# Patient Record
Sex: Male | Born: 1955
Health system: Southern US, Community
[De-identification: ages and names within clinical notes are randomized; demographics above are authoritative.]

## PROBLEM LIST (undated history)

## (undated) DIAGNOSIS — E785 Hyperlipidemia, unspecified: Secondary | ICD-10-CM

## (undated) DIAGNOSIS — M549 Dorsalgia, unspecified: Secondary | ICD-10-CM

## (undated) DIAGNOSIS — M6289 Other specified disorders of muscle: Secondary | ICD-10-CM

## (undated) DIAGNOSIS — R0602 Shortness of breath: Secondary | ICD-10-CM

## (undated) DIAGNOSIS — E119 Type 2 diabetes mellitus without complications: Secondary | ICD-10-CM

## (undated) DIAGNOSIS — G4730 Sleep apnea, unspecified: Secondary | ICD-10-CM

## (undated) DIAGNOSIS — R5383 Other fatigue: Secondary | ICD-10-CM

## (undated) HISTORY — PX: NO PAST SURGERIES: SHX2092

## (undated) HISTORY — DX: Dorsalgia, unspecified: M54.9

## (undated) HISTORY — DX: Other specified disorders of muscle: M62.89

## (undated) HISTORY — DX: Shortness of breath: R06.02

## (undated) HISTORY — PX: BACK SURGERY: SHX140

## (undated) HISTORY — DX: Type 2 diabetes mellitus without complications: E11.9

## (undated) HISTORY — DX: Other fatigue: R53.83

## (undated) HISTORY — PX: COLONOSCOPY: SHX174

---

## 2000-01-25 ENCOUNTER — Other Ambulatory Visit: Admission: RE | Admit: 2000-01-25 | Discharge: 2000-01-25 | Payer: Self-pay | Admitting: Urology

## 2000-07-27 ENCOUNTER — Encounter: Payer: Self-pay | Admitting: Otolaryngology

## 2000-07-27 ENCOUNTER — Ambulatory Visit (HOSPITAL_COMMUNITY): Admission: RE | Admit: 2000-07-27 | Discharge: 2000-07-27 | Payer: Self-pay | Admitting: Otolaryngology

## 2003-12-11 ENCOUNTER — Ambulatory Visit (HOSPITAL_BASED_OUTPATIENT_CLINIC_OR_DEPARTMENT_OTHER): Admission: RE | Admit: 2003-12-11 | Discharge: 2003-12-11 | Payer: Self-pay | Admitting: Family Medicine

## 2006-06-06 ENCOUNTER — Ambulatory Visit: Payer: Self-pay | Admitting: Infectious Diseases

## 2009-08-22 ENCOUNTER — Encounter: Admission: RE | Admit: 2009-08-22 | Discharge: 2009-08-22 | Payer: Self-pay | Admitting: Urology

## 2012-08-03 ENCOUNTER — Other Ambulatory Visit: Payer: Self-pay | Admitting: Urology

## 2012-08-03 DIAGNOSIS — Z8051 Family history of malignant neoplasm of kidney: Secondary | ICD-10-CM

## 2012-08-04 ENCOUNTER — Ambulatory Visit
Admission: RE | Admit: 2012-08-04 | Discharge: 2012-08-04 | Disposition: A | Discharge status: Home / Self Care | Payer: 59 | Source: Ambulatory Visit | Attending: Urology | Admitting: Urology

## 2012-08-04 DIAGNOSIS — Z8051 Family history of malignant neoplasm of kidney: Secondary | ICD-10-CM

## 2013-11-23 ENCOUNTER — Encounter: Payer: Self-pay | Admitting: Family Medicine

## 2013-11-23 ENCOUNTER — Ambulatory Visit (INDEPENDENT_AMBULATORY_CARE_PROVIDER_SITE_OTHER): Payer: 59 | Admitting: Family Medicine

## 2013-11-23 VITALS — Ht 71.0 in | Wt 240.8 lb

## 2013-11-23 DIAGNOSIS — R7303 Prediabetes: Secondary | ICD-10-CM

## 2013-11-23 DIAGNOSIS — R7309 Other abnormal glucose: Secondary | ICD-10-CM

## 2013-11-23 NOTE — Progress Notes (Signed)
Medical Nutrition Therapy:  Appt start time: 1430 end time:  1530.  Assessment:  Primary concerns today: Weight management and Blood sugar control.  [name] Wood's BG had been increasing, and Dr. Azucena Kubaeid has told him he is pre-diabetic.  [name] does not want to use any med for DM.  A1C last fall was "high-6," and after a 10-lb wt loss he got it down to 6.1.  He has been trying to limit carb's, ice cream, etc.  He is using [employer], and is targeting no more than 1800 kcal/day.  He has lost ~20 lb since November.  He would like to get under 200 lb.    Usual eating pattern includes 3 meals and 0-2 snacks per day.  Restaurant meals are frequent.   Usual physical activity includes 30-40 min TM at home 4 X wk.   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  He has a son who is a Printmakerfreshman in college, and a daughter who is a Printmakerfreshman in h.s.   Frequent foods include breakfast 3-4 X wk of 2 scrmbled eggs, canadian bacon, 1/3 c blueberries OR steel-cut oats (1/2 c dry) w/ bl'berries and walnuts; coffee w/ sugar-free flavored creamer.      Progress Towards Goal(s):  In progress.   Nutritional Diagnosis:  -2.2 Altered nutrition-related laboratory As related to blood glucose.  As evidenced by A1C above 6.    Intervention:  Nutrition education.  Monitoring/Evaluation:  Dietary intake, exercise, and body weight April 13 at 10 AM.  NOTE:  [name] Wood'S WIFE CALLED DURING HIS APPT, AND HE HAD TO [facility] HOME TO DRIVE HER TO URGENT CARE FOR STITCHES ON HER FINGER.  WILL FINISH APPT ON April 13.

## 2013-11-23 NOTE — Patient Instructions (Signed)
NOTE:  GREG'S WIFE CALLED DURING HIS APPT, AND HE HAD TO [facility] HOME TO DRIVE HER TO URGENT CARE FOR STITCHES ON HER FINGER.  WILL FINISH APPT ON April 13.

## 2013-12-06 ENCOUNTER — Encounter: Payer: Self-pay | Admitting: Family Medicine

## 2013-12-06 NOTE — Progress Notes (Signed)
Patient ID: [name] Wood, male   DOB: Dec 27, 1955, 58 y.o.   MRN: [id number] [name] Wood has been checking FBG and bedtime on alternating days.  FBG have been running 112-140; evenings (3.5-4 hrs post-prandials) are 100-125.  He is still tracking most days with a food and ex app.   Recommendations provided today (follow-up from last appt on 3/31):  Diet Recommendations for Diabetes   Starchy (carb) foods include: Bread, rice, pasta, potatoes, corn, crackers, bagels, muffins, all baked goods.  (Fruits, milk, and yogurt also have carbohydrate, but most of these foods will not spike your blood sugar as the starchy foods will.)  A few fruits do cause high blood sugars; use small portions of bananas (limit to 1/2 at a time), grapes, and most tropical fruits.    Protein foods include: Meat, fish, poultry, eggs, dairy foods, and beans such as pinto and kidney beans (beans also provide carbohydrate).   1. Eat at least 3 meals and 1-2 snacks per day. Never go more than 4-5 hours while awake without eating.  2. Limit starchy foods to TWO per meal and ONE per snack. ONE portion of a starchy  food is equal to the following:   - ONE slice of bread (or its equivalent, such as half of a hamburger bun).   - 1/2 cup of a "scoopable" starchy food such as potatoes or rice.   - 15 grams of carbohydrate as shown on food label.  3. Both lunch and dinner should include a protein food, a carb food, and vegetables.   - Obtain twice as many veg's as protein or carbohydrate foods for both lunch and dinner.   - Fresh or frozen veg's are best.   - Try to keep frozen veg's on hand for a quick vegetable serving.    4. Breakfast should always include protein.   5. Exercise:  30 min at least 4 days a week.  In addn, it's important to move throughout the day (micro-breaks of 5 min moving for every hour sitting or 1 min for every 20 min).    Nuts are loaded with fat; therefore, you have to be careful with quantity.   c = 200 kcal.   They are a very high quality of fat, but lots of kcal.  Always (as with any food) put them on a plate/in a bowl.  Principle of wt mgmt: SEE all you eat.    Restaurants:  Practice sequential eating; veg's first, meat or calorie-dense foods last.    When you get a very high or very low BG reading, immediately think back to what you last ate: What, how much, and what time?  (Is there anything else going on that can raise/lower your BG - exercise, stress, sleep deprivation, sickness?)

## 2015-11-10 ENCOUNTER — Other Ambulatory Visit: Payer: Self-pay | Admitting: Urology

## 2015-11-10 DIAGNOSIS — R3129 Other microscopic hematuria: Secondary | ICD-10-CM

## 2015-12-15 ENCOUNTER — Ambulatory Visit
Admission: RE | Admit: 2015-12-15 | Discharge: 2015-12-15 | Disposition: A | Discharge status: Home / Self Care | Payer: 59 | Source: Ambulatory Visit | Attending: Urology | Admitting: Urology

## 2015-12-15 DIAGNOSIS — R3129 Other microscopic hematuria: Secondary | ICD-10-CM

## 2016-08-28 ENCOUNTER — Ambulatory Visit: Payer: Self-pay | Admitting: Orthopedic Surgery

## 2016-09-13 ENCOUNTER — Ambulatory Visit: Payer: Self-pay | Admitting: Orthopedic Surgery

## 2016-09-13 NOTE — H&P (Signed)
[name] Wood is an 61 y.o. male.   Chief Complaint: back and left leg pain HPI: The patient is a 61 year old male who presents today for follow up of their back. The patient is being followed for their low back symptoms. They are now month(s) out from a flare up. Symptoms reported today include: pain. Current treatment includes: physical therapy, activity modification and pain medications. The following medication has been used for pain control: gabapentin. The patient presents today following physical therapy.  [name] Wood follows up after physical therapy. He continues to have significant pain radiating down into his leg. This is now over six months in duration. He had an epidural, periodically giving him temporary relief at L4-L5 on the left. Unfortunately, recently it was temporary. He was seen in physical therapy by [name] Wood and he is having pain. It helps him for about an hour then the pain returns, both with sitting and standing and walking. He is taking gabapentin.  Review of systems is negative for fevers, chest pain, shortness of breath, unexplained recent weight loss, loss of bowel or bladder function, burning with urination, joint swelling, rashes, weakness or numbness, difficulty with balance, easy bruising, excessive thirst or frequent urination.  He reports he is otherwise healthy. No chest pain, shortness of breath.  Past Medical Hx Hyperlipidemia  No past surgical history on file.  No family history on file. Social History:  reports that he has quit smoking. He does not have any smokeless tobacco history on file. He reports that he drinks about 0.5 oz of alcohol per week . His drug history is not on file.  Allergies: NKDA  Review of Systems  Constitutional: Negative.   HENT: Negative.   Eyes: Negative.   Respiratory: Negative.   Cardiovascular: Negative.   Gastrointestinal: Negative.   Genitourinary: Negative.   Musculoskeletal: Positive for back pain.  Skin:  Negative.   Neurological: Positive for sensory change and focal weakness.  Psychiatric/Behavioral: Negative.     There were no vitals taken for this visit. Physical Exam  Constitutional: He is oriented to person, place, and time. He appears well-developed. He appears distressed.  HENT:  Head: Normocephalic.  Eyes: Pupils are equal, round, and reactive to light.  Neck: Normal range of motion.  Cardiovascular: Normal rate.   Respiratory: Effort normal.  GI: Soft.  Musculoskeletal:  He is upright, mild distress. Mood and affect is appropriate. His straight leg raise on the left buttock, thigh, and calf pain. On the right it is negative. EHL is still 4+/5 on the left compared to the right, slightly altered sensation in S1 dermatome.  Lumbar spine exam reveals no evidence of soft tissue swelling, deformity or skin ecchymosis. On palpation there is no tenderness of the lumbar spine. No flank pain with percussion. The abdomen is soft and nontender. Nontender over the trochanters. No cellulitis or lymphadenopathy.  Good range of motion of the lumbar spine without associated pain. Motor is 5/5 including tibialis anterior, plantar flexion, quadriceps and hamstrings. Patient is normoreflexic. There is no Babinski or clonus. Sensory exam is intact to light touch. Patient has good distal pulses. No DVT. No pain and normal range of motion without instability of the hips, knees and ankles.  Neurological: He is alert and oriented to person, place, and time.    MRI demonstrates multilevel disc degeneration 3-4, 4-5, and 5-1. He has a disc protrusion at 3-4, mild lateral recess stenosis. He has no L3-L4 radicular pain. Extruded disk herniation with disk osteophyte  complex at 4-5 with severe lateral recess stenosis, affecting the L5 root. At L5-S1, he has a central disk protrusion not affecting the S1 nerve root. He has no S1 nerve root symptoms. On his patient's pain drawing today, he has specific L5 radicular  dermatomal pain.  Assessment/Plan L5 radicular pain secondary to disc herniation, disc osteophyte complex and spinal stenosis at L4-L5, spacing the L5 root with myotomal weakness, dermatomal dysesthesia, despite rest, activity modification, epidural, physical therapy.  We extensively discussed concerning current pathology, relevant anatomy, treatment options, living with his symptoms versus microlumbar decompression at 4-5, decompressing L5 root.  I had an extensive discussion of the risks and benefits of the lumbar decompression with the patient including bleeding, infection, damage to neurovascular structures, epidural fibrosis, CSF leak requiring repair. We also discussed increase in pain, adjacent segment disease, recurrent disc herniation, need for future surgery including repeat decompression and/or fusion. We also discussed risks of postoperative hematoma, paralysis, anesthetic complications including DVT, PE, death, cardiopulmonary dysfunction. In addition, the perioperative and postoperative courses were discussed in detail including the rehabilitative time and return to functional activity and work. I provided the patient with an illustrated handout and utilized the appropriate surgical models.  Specifically discussed decompressing the nerve root to allow it to heal which can take a period of time given the duration of neural compression and residual neurologic deficit is certainly possible. Hopefully, though with the decompression and continued PT, the myotomal weakness will improve. We discussed the hospital course postoperative course in detail. No history of DVT, MRSA exposure, chest pain, shortness of breath, stroke, or diabetes. Out of work up to 12 weeks, possible return to light duty in two weeks. We will proceed with scheduling.  Plan microlumbar decompression L4-5 left  [name] SparkBISSELL, [name] Godwin M., PA-C for Dr. Shelle Wood 09/13/2016, 11:35 AM

## 2016-09-23 NOTE — Patient Instructions (Addendum)
[name] Wood  09/23/2016   Your procedure is scheduled on: Wednesday 10/02/2016  Report to [facility] Main  Entrance take [place]  elevators to 3rd floor to  Short Stay Center at  0630  AM.  Call this number if you have problems the morning of surgery [phone]   Remember: ONLY 1 PERSON MAY GO WITH YOU TO SHORT STAY TO GET  READY MORNING OF YOUR SURGERY.   Do not eat food or drink liquids :After Midnight.    PLEASE BRING YOUR CPAP MASK AND TUBING WITH YOU TO THE HOSPITAL THE MORNING OF SURGERY!    Take these medicines the morning of surgery with A SIP OF WATER: none                                 You may not have any metal on your body including hair pins and              piercings  Do not wear jewelry, make-up, lotions, powders or perfumes, deodorant             Do not wear nail polish.  Do not shave  48 hours prior to surgery.              Men may shave face and neck.   Do not bring valuables to the hospital. [facility] IS NOT             RESPONSIBLE   FOR VALUABLES.  Contacts, dentures or bridgework may not be worn into surgery.  Leave suitcase in the car. After surgery it may be brought to your room.                  Please read over the following fact sheets you were given: _____________________________________________________________________             [facility] - Preparing for Surgery Before surgery, you can play an important role.  Because skin is not sterile, your skin needs to be as free of germs as possible.  You can reduce the number of germs on your skin by washing with CHG (chlorahexidine gluconate) soap before surgery.  CHG is an antiseptic cleaner which kills germs and bonds with the skin to continue killing germs even after washing. Please DO NOT use if you have an allergy to CHG or antibacterial soaps.  If your skin becomes reddened/irritated stop using the CHG and inform your nurse when you arrive at Short Stay. Do not  shave (including legs and underarms) for at least 48 hours prior to the first CHG shower.  You may shave your face/neck. Please follow these instructions carefully:  1.  Shower with CHG Soap the night before surgery and the  morning of Surgery.  2.  If you choose to wash your hair, wash your hair first as usual with your  normal  shampoo.  3.  After you shampoo, rinse your hair and body thoroughly to remove the  shampoo.                           4.  Use CHG as you would any other liquid soap.  You can apply chg directly  to the skin and wash  Gently with a scrungie or clean washcloth.  5.  Apply the CHG Soap to your body ONLY FROM THE NECK DOWN.   Do not use on face/ open                           Wound or open sores. Avoid contact with eyes, ears mouth and genitals (private parts).                       Wash face,  Genitals (private parts) with your normal soap.             6.  Wash thoroughly, paying special attention to the area where your surgery  will be performed.  7.  Thoroughly rinse your body with warm water from the neck down.  8.  DO NOT shower/wash with your normal soap after using and rinsing off  the CHG Soap.                9.  Pat yourself dry with a clean towel.            10.  Wear clean pajamas.            11.  Place clean sheets on your bed the night of your first shower and do not  sleep with pets. Day of Surgery : Do not apply any lotions/deodorants the morning of surgery.  Please wear clean clothes to the hospital/surgery center.  FAILURE TO FOLLOW THESE INSTRUCTIONS MAY RESULT IN THE CANCELLATION OF YOUR SURGERY PATIENT SIGNATURE_________________________________  NURSE SIGNATURE__________________________________  _

## 2016-09-24 ENCOUNTER — Ambulatory Visit (HOSPITAL_COMMUNITY)
Admission: RE | Admit: 2016-09-24 | Discharge: 2016-09-24 | Disposition: A | Discharge status: Home / Self Care | Payer: 59 | Source: Ambulatory Visit | Attending: Orthopedic Surgery | Admitting: Orthopedic Surgery

## 2016-09-24 ENCOUNTER — Encounter (HOSPITAL_COMMUNITY): Payer: Self-pay

## 2016-09-24 ENCOUNTER — Encounter (HOSPITAL_COMMUNITY)
Admission: RE | Admit: 2016-09-24 | Discharge: 2016-09-24 | Disposition: A | Discharge status: Home / Self Care | Payer: 59 | Source: Ambulatory Visit | Attending: Specialist | Admitting: Specialist

## 2016-09-24 DIAGNOSIS — M48061 Spinal stenosis, lumbar region without neurogenic claudication: Secondary | ICD-10-CM | POA: Insufficient documentation

## 2016-09-24 DIAGNOSIS — M5136 Other intervertebral disc degeneration, lumbar region: Secondary | ICD-10-CM | POA: Diagnosis not present

## 2016-09-24 DIAGNOSIS — Z01818 Encounter for other preprocedural examination: Secondary | ICD-10-CM | POA: Diagnosis not present

## 2016-09-24 DIAGNOSIS — M545 Low back pain: Secondary | ICD-10-CM | POA: Diagnosis not present

## 2016-09-24 DIAGNOSIS — M5137 Other intervertebral disc degeneration, lumbosacral region: Secondary | ICD-10-CM | POA: Diagnosis not present

## 2016-09-24 DIAGNOSIS — I700 Atherosclerosis of aorta: Secondary | ICD-10-CM | POA: Diagnosis not present

## 2016-09-24 DIAGNOSIS — M5126 Other intervertebral disc displacement, lumbar region: Secondary | ICD-10-CM

## 2016-09-24 HISTORY — DX: Sleep apnea, unspecified: G47.30

## 2016-09-24 LAB — BASIC METABOLIC PANEL
ANION GAP: 7 (ref 5–15)
BUN: 24 mg/dL — ABNORMAL HIGH (ref 6–20)
CHLORIDE: 108 mmol/L (ref 101–111)
CO2: 24 mmol/L (ref 22–32)
Calcium: 9.1 mg/dL (ref 8.9–10.3)
Creatinine, Ser: 0.87 mg/dL (ref 0.61–1.24)
GFR calc Af Amer: >60 mL/min (ref >60)
GLUCOSE: 103 mg/dL — AB (ref 65–99)
POTASSIUM: 4.6 mmol/L (ref 3.5–5.1)
Sodium: 139 mmol/L (ref 135–145)

## 2016-09-24 LAB — CBC
HEMATOCRIT: 39.4 % (ref 39.0–52.0)
HEMOGLOBIN: 14.1 g/dL (ref 13.0–17.0)
MCH: 32.6 pg (ref 26.0–34.0)
MCHC: 35.8 g/dL (ref 30.0–36.0)
MCV: 91.2 fL (ref 78.0–100.0)
Platelets: 210 10*3/uL (ref 150–400)
RBC: 4.32 MIL/uL (ref 4.22–5.81)
RDW: 11.8 % (ref 11.5–15.5)
WBC: 9.1 10*3/uL (ref 4.0–10.5)

## 2016-09-24 LAB — SURGICAL PCR SCREEN
MRSA, PCR: NEGATIVE
STAPHYLOCOCCUS AUREUS: NEGATIVE

## 2016-10-02 ENCOUNTER — Ambulatory Visit (HOSPITAL_COMMUNITY): Payer: 59

## 2016-10-02 ENCOUNTER — Ambulatory Visit (HOSPITAL_COMMUNITY)
Admission: RE | Admit: 2016-10-02 | Discharge: 2016-10-02 | Disposition: A | Discharge status: Home / Self Care | Payer: 59 | Source: Ambulatory Visit | Attending: Specialist | Admitting: Specialist

## 2016-10-02 ENCOUNTER — Encounter (HOSPITAL_COMMUNITY)
Admission: RE | Disposition: A | Discharge status: Home / Self Care | Payer: Self-pay | Source: Ambulatory Visit | Attending: Specialist

## 2016-10-02 ENCOUNTER — Ambulatory Visit (HOSPITAL_COMMUNITY): Payer: 59 | Admitting: Registered Nurse

## 2016-10-02 ENCOUNTER — Encounter (HOSPITAL_COMMUNITY): Payer: Self-pay | Admitting: *Deleted

## 2016-10-02 DIAGNOSIS — S20419A Abrasion of unspecified back wall of thorax, initial encounter: Secondary | ICD-10-CM | POA: Diagnosis not present

## 2016-10-02 DIAGNOSIS — M48061 Spinal stenosis, lumbar region without neurogenic claudication: Secondary | ICD-10-CM | POA: Diagnosis present

## 2016-10-02 DIAGNOSIS — G4730 Sleep apnea, unspecified: Secondary | ICD-10-CM | POA: Insufficient documentation

## 2016-10-02 DIAGNOSIS — M2578 Osteophyte, vertebrae: Secondary | ICD-10-CM | POA: Insufficient documentation

## 2016-10-02 DIAGNOSIS — M5116 Intervertebral disc disorders with radiculopathy, lumbar region: Secondary | ICD-10-CM | POA: Diagnosis not present

## 2016-10-02 DIAGNOSIS — Z9989 Dependence on other enabling machines and devices: Secondary | ICD-10-CM | POA: Insufficient documentation

## 2016-10-02 DIAGNOSIS — M5126 Other intervertebral disc displacement, lumbar region: Secondary | ICD-10-CM | POA: Diagnosis not present

## 2016-10-02 DIAGNOSIS — G4733 Obstructive sleep apnea (adult) (pediatric): Secondary | ICD-10-CM | POA: Diagnosis not present

## 2016-10-02 DIAGNOSIS — M5136 Other intervertebral disc degeneration, lumbar region: Secondary | ICD-10-CM | POA: Diagnosis not present

## 2016-10-02 DIAGNOSIS — M4807 Spinal stenosis, lumbosacral region: Secondary | ICD-10-CM | POA: Diagnosis not present

## 2016-10-02 DIAGNOSIS — Z87891 Personal history of nicotine dependence: Secondary | ICD-10-CM | POA: Diagnosis not present

## 2016-10-02 DIAGNOSIS — M48062 Spinal stenosis, lumbar region with neurogenic claudication: Secondary | ICD-10-CM | POA: Diagnosis not present

## 2016-10-02 HISTORY — PX: LUMBAR LAMINECTOMY/DECOMPRESSION MICRODISCECTOMY: SHX5026

## 2016-10-02 SURGERY — LUMBAR LAMINECTOMY/DECOMPRESSION MICRODISCECTOMY 1 LEVEL
Anesthesia: General | Site: Back | Laterality: Left

## 2016-10-02 MED ORDER — FENTANYL CITRATE (PF) 100 MCG/2ML IJ SOLN
INTRAMUSCULAR | Status: DC | PRN
  Administered 2016-10-02 (×2): 25 ug via INTRAVENOUS
  Administered 2016-10-02: 75 ug via INTRAVENOUS
  Administered 2016-10-02: 50 ug via INTRAVENOUS
  Administered 2016-10-02: 25 ug via INTRAVENOUS

## 2016-10-02 MED ORDER — SODIUM CHLORIDE 0.9 % IR SOLN
Status: AC
  Filled 2016-10-02: qty 500000

## 2016-10-02 MED ORDER — METOCLOPRAMIDE HCL 5 MG/ML IJ SOLN: 10.0000 mg | Freq: Once | INTRAMUSCULAR | Status: DC | PRN

## 2016-10-02 MED ORDER — CEFAZOLIN SODIUM-DEXTROSE 2-4 GM/100ML-% IV SOLN
2.0000 g | Freq: Three times a day (TID) | INTRAVENOUS | Status: DC
  Administered 2016-10-02: 2 g via INTRAVENOUS
  Filled 2016-10-02: qty 100

## 2016-10-02 MED ORDER — DOCUSATE SODIUM 100 MG PO CAPS: 100.0000 mg | ORAL_CAPSULE | Freq: Two times a day (BID) | ORAL | 0 refills | Status: DC

## 2016-10-02 MED ORDER — POLYETHYLENE GLYCOL 3350 17 G PO PACK: 17.0000 g | PACK | Freq: Every day | ORAL | 0 refills | Status: DC | PRN

## 2016-10-02 MED ORDER — ALUM & MAG HYDROXIDE-SIMETH 200-200-20 MG/5ML PO SUSP: 30.0000 mL | Freq: Four times a day (QID) | ORAL | Status: DC | PRN

## 2016-10-02 MED ORDER — PROPOFOL 10 MG/ML IV BOLUS
INTRAVENOUS | Status: AC
  Filled 2016-10-02: qty 20

## 2016-10-02 MED ORDER — FENTANYL CITRATE (PF) 100 MCG/2ML IJ SOLN
25.0000 ug | INTRAMUSCULAR | Status: DC | PRN
  Administered 2016-10-02 (×2): 50 ug via INTRAVENOUS

## 2016-10-02 MED ORDER — METHOCARBAMOL 500 MG PO TABS: 500.0000 mg | ORAL_TABLET | Freq: Four times a day (QID) | ORAL | Status: DC | PRN

## 2016-10-02 MED ORDER — ROCURONIUM BROMIDE 50 MG/5ML IV SOSY
PREFILLED_SYRINGE | INTRAVENOUS | Status: AC
  Filled 2016-10-02: qty 5

## 2016-10-02 MED ORDER — SUGAMMADEX SODIUM 200 MG/2ML IV SOLN
INTRAVENOUS | Status: AC
  Filled 2016-10-02: qty 2

## 2016-10-02 MED ORDER — PROPOFOL 10 MG/ML IV BOLUS
INTRAVENOUS | Status: DC | PRN
  Administered 2016-10-02: 110 mg via INTRAVENOUS
  Administered 2016-10-02: 200 mg via INTRAVENOUS

## 2016-10-02 MED ORDER — ACETAMINOPHEN 10 MG/ML IV SOLN
INTRAVENOUS | Status: AC
  Filled 2016-10-02: qty 100

## 2016-10-02 MED ORDER — LACTATED RINGERS IV SOLN: INTRAVENOUS | Status: DC

## 2016-10-02 MED ORDER — LACTATED RINGERS IV SOLN
INTRAVENOUS | Status: DC
  Administered 2016-10-02: 08:00:00 via INTRAVENOUS

## 2016-10-02 MED ORDER — MENTHOL 3 MG MT LOZG: 1.0000 | LOZENGE | OROMUCOSAL | Status: DC | PRN

## 2016-10-02 MED ORDER — SUCCINYLCHOLINE CHLORIDE 200 MG/10ML IV SOSY
PREFILLED_SYRINGE | INTRAVENOUS | Status: AC
  Filled 2016-10-02: qty 10

## 2016-10-02 MED ORDER — DEXAMETHASONE SODIUM PHOSPHATE 10 MG/ML IJ SOLN
INTRAMUSCULAR | Status: DC | PRN
  Administered 2016-10-02: 10 mg via INTRAVENOUS

## 2016-10-02 MED ORDER — KCL IN DEXTROSE-NACL 20-5-0.45 MEQ/L-%-% IV SOLN
INTRAVENOUS | Status: DC
  Filled 2016-10-02: qty 1000

## 2016-10-02 MED ORDER — ACETAMINOPHEN 10 MG/ML IV SOLN
INTRAVENOUS | Status: DC | PRN
  Administered 2016-10-02: 1000 mg via INTRAVENOUS

## 2016-10-02 MED ORDER — SUGAMMADEX SODIUM 200 MG/2ML IV SOLN
INTRAVENOUS | Status: DC | PRN
  Administered 2016-10-02: 200 mg via INTRAVENOUS

## 2016-10-02 MED ORDER — CEFAZOLIN SODIUM-DEXTROSE 2-4 GM/100ML-% IV SOLN
INTRAVENOUS | Status: AC
  Filled 2016-10-02: qty 100

## 2016-10-02 MED ORDER — FENTANYL CITRATE (PF) 100 MCG/2ML IJ SOLN
INTRAMUSCULAR | Status: AC
  Filled 2016-10-02: qty 4

## 2016-10-02 MED ORDER — PHENOL 1.4 % MT LIQD: 1.0000 | OROMUCOSAL | Status: DC | PRN

## 2016-10-02 MED ORDER — OXYCODONE-ACETAMINOPHEN 5-325 MG PO TABS
1.0000 | ORAL_TABLET | ORAL | Status: DC | PRN
Start: 2016-10-02 — End: 2016-10-02

## 2016-10-02 MED ORDER — MAGNESIUM CITRATE PO SOLN
1.0000 | Freq: Once | ORAL | Status: DC | PRN
Start: 2016-10-02 — End: 2016-10-02

## 2016-10-02 MED ORDER — FENTANYL CITRATE (PF) 100 MCG/2ML IJ SOLN
INTRAMUSCULAR | Status: AC
  Filled 2016-10-02: qty 2

## 2016-10-02 MED ORDER — MEPERIDINE HCL 50 MG/ML IJ SOLN
6.2500 mg | INTRAMUSCULAR | Status: DC | PRN
Start: 2016-10-02 — End: 2016-10-02

## 2016-10-02 MED ORDER — CEFAZOLIN SODIUM-DEXTROSE 2-4 GM/100ML-% IV SOLN
2.0000 g | INTRAVENOUS | Status: AC
  Administered 2016-10-02: 2 g via INTRAVENOUS

## 2016-10-02 MED ORDER — ONDANSETRON HCL 4 MG/2ML IJ SOLN: 4.0000 mg | INTRAMUSCULAR | Status: DC | PRN

## 2016-10-02 MED ORDER — HYDROCODONE-ACETAMINOPHEN 5-325 MG PO TABS
1.0000 | ORAL_TABLET | ORAL | Status: DC | PRN
  Administered 2016-10-02: 1 via ORAL
  Filled 2016-10-02: qty 1

## 2016-10-02 MED ORDER — GELATIN ABSORBABLE MT POWD
OROMUCOSAL | Status: DC | PRN
  Administered 2016-10-02: 10000 mL via TOPICAL

## 2016-10-02 MED ORDER — POLYETHYLENE GLYCOL 3350 17 G PO PACK: 17.0000 g | PACK | Freq: Every day | ORAL | Status: DC | PRN

## 2016-10-02 MED ORDER — ROCURONIUM BROMIDE 10 MG/ML (PF) SYRINGE
PREFILLED_SYRINGE | INTRAVENOUS | Status: DC | PRN
  Administered 2016-10-02: 40 mg via INTRAVENOUS
  Administered 2016-10-02 (×3): 5 mg via INTRAVENOUS

## 2016-10-02 MED ORDER — IBUPROFEN 200 MG PO TABS: 400.0000 mg | ORAL_TABLET | Freq: Every day | ORAL | 0 refills | Status: DC | PRN

## 2016-10-02 MED ORDER — DEXAMETHASONE SODIUM PHOSPHATE 10 MG/ML IJ SOLN
INTRAMUSCULAR | Status: AC
  Filled 2016-10-02: qty 1

## 2016-10-02 MED ORDER — BUPIVACAINE-EPINEPHRINE (PF) 0.5% -1:200000 IJ SOLN
INTRAMUSCULAR | Status: DC | PRN
  Administered 2016-10-02: 16 mL

## 2016-10-02 MED ORDER — ACETAMINOPHEN 650 MG RE SUPP: 650.0000 mg | RECTAL | Status: DC | PRN

## 2016-10-02 MED ORDER — SODIUM CHLORIDE 0.9 % IR SOLN
Status: DC | PRN
  Administered 2016-10-02: 500 mL

## 2016-10-02 MED ORDER — HYDROMORPHONE HCL 1 MG/ML IJ SOLN: 0.5000 mg | INTRAMUSCULAR | Status: DC | PRN

## 2016-10-02 MED ORDER — METHOCARBAMOL 1000 MG/10ML IJ SOLN
500.0000 mg | Freq: Four times a day (QID) | INTRAVENOUS | Status: DC | PRN
  Administered 2016-10-02: 500 mg via INTRAVENOUS
  Filled 2016-10-02: qty 550
  Filled 2016-10-02: qty 5

## 2016-10-02 MED ORDER — THROMBIN 5000 UNITS EX SOLR
CUTANEOUS | Status: AC
  Filled 2016-10-02: qty 10000

## 2016-10-02 MED ORDER — DOCUSATE SODIUM 100 MG PO CAPS
100.0000 mg | ORAL_CAPSULE | Freq: Two times a day (BID) | ORAL | Status: DC
  Administered 2016-10-02: 100 mg via ORAL
  Filled 2016-10-02: qty 1

## 2016-10-02 MED ORDER — CHLORHEXIDINE GLUCONATE 4 % EX LIQD: 60.0000 mL | Freq: Once | CUTANEOUS | Status: DC

## 2016-10-02 MED ORDER — BISACODYL 5 MG PO TBEC: 5.0000 mg | DELAYED_RELEASE_TABLET | Freq: Every day | ORAL | Status: DC | PRN

## 2016-10-02 MED ORDER — RISAQUAD PO CAPS
1.0000 | ORAL_CAPSULE | Freq: Every day | ORAL | Status: DC
  Administered 2016-10-02: 1 via ORAL
  Filled 2016-10-02: qty 1

## 2016-10-02 MED ORDER — MIDAZOLAM HCL 2 MG/2ML IJ SOLN
INTRAMUSCULAR | Status: AC
  Filled 2016-10-02: qty 2

## 2016-10-02 MED ORDER — SUCCINYLCHOLINE CHLORIDE 200 MG/10ML IV SOSY
PREFILLED_SYRINGE | INTRAVENOUS | Status: DC | PRN
  Administered 2016-10-02 (×2): 100 mg via INTRAVENOUS

## 2016-10-02 MED ORDER — PRAVASTATIN SODIUM 20 MG PO TABS: 40.0000 mg | ORAL_TABLET | Freq: Every evening | ORAL | Status: DC

## 2016-10-02 MED ORDER — ONDANSETRON HCL 4 MG/2ML IJ SOLN
INTRAMUSCULAR | Status: AC
  Filled 2016-10-02: qty 2

## 2016-10-02 MED ORDER — ACETAMINOPHEN 325 MG PO TABS: 650.0000 mg | ORAL_TABLET | ORAL | Status: DC | PRN

## 2016-10-02 MED ORDER — MIDAZOLAM HCL 5 MG/5ML IJ SOLN
INTRAMUSCULAR | Status: DC | PRN
  Administered 2016-10-02: 2 mg via INTRAVENOUS

## 2016-10-02 MED ORDER — SUGAMMADEX SODIUM 500 MG/5ML IV SOLN
INTRAVENOUS | Status: AC
  Filled 2016-10-02: qty 5

## 2016-10-02 MED ORDER — LIDOCAINE 2% (20 MG/ML) 5 ML SYRINGE
INTRAMUSCULAR | Status: AC
  Filled 2016-10-02: qty 5

## 2016-10-02 MED ORDER — LIDOCAINE HCL (CARDIAC) 20 MG/ML IV SOLN
INTRAVENOUS | Status: DC | PRN
  Administered 2016-10-02: 100 mg via INTRAVENOUS

## 2016-10-02 MED ORDER — BUPIVACAINE-EPINEPHRINE (PF) 0.5% -1:200000 IJ SOLN
INTRAMUSCULAR | Status: AC
  Filled 2016-10-02: qty 30

## 2016-10-02 MED ORDER — ONDANSETRON HCL 4 MG/2ML IJ SOLN
INTRAMUSCULAR | Status: DC | PRN
  Administered 2016-10-02: 4 mg via INTRAVENOUS

## 2016-10-02 SURGICAL SUPPLY — 48 items
BAG ZIPLOCK 12X15 (MISCELLANEOUS) IMPLANT
CLEANER TIP ELECTROSURG 2X2 (MISCELLANEOUS) ×2 IMPLANT
CLOTH 2% CHLOROHEXIDINE 3PK (PERSONAL CARE ITEMS) ×2 IMPLANT
DRAPE MICROSCOPE LEICA (MISCELLANEOUS) ×2 IMPLANT
DRAPE POUCH INSTRU U-SHP 10X18 (DRAPES) ×2 IMPLANT
DRAPE SHEET LG 3/4 BI-LAMINATE (DRAPES) ×2 IMPLANT
DRAPE SURG 17X11 SM STRL (DRAPES) ×2 IMPLANT
DRAPE UTILITY XL STRL (DRAPES) ×2 IMPLANT
DRSG AQUACEL AG ADV 3.5X 4 (GAUZE/BANDAGES/DRESSINGS) IMPLANT
DRSG AQUACEL AG ADV 3.5X 6 (GAUZE/BANDAGES/DRESSINGS) ×2 IMPLANT
DRSG TELFA 3X8 NADH (GAUZE/BANDAGES/DRESSINGS) ×2 IMPLANT
DURAPREP 26ML APPLICATOR (WOUND CARE) ×2 IMPLANT
DURASEAL SPINE SEALANT 3ML (MISCELLANEOUS) IMPLANT
ELECT BLADE TIP CTD 4 INCH (ELECTRODE) ×2 IMPLANT
ELECT REM PT RETURN 9FT ADLT (ELECTROSURGICAL) ×2
ELECTRODE REM PT RTRN 9FT ADLT (ELECTROSURGICAL) ×1 IMPLANT
GLOVE BIOGEL PI IND STRL 7.0 (GLOVE) ×1 IMPLANT
GLOVE BIOGEL PI INDICATOR 7.0 (GLOVE) ×1
GLOVE SURG SS PI 7.0 STRL IVOR (GLOVE) ×2 IMPLANT
GLOVE SURG SS PI 7.5 STRL IVOR (GLOVE) IMPLANT
GLOVE SURG SS PI 8.0 STRL IVOR (GLOVE) ×4 IMPLANT
GOWN STRL REUS W/TWL XL LVL3 (GOWN DISPOSABLE) ×4 IMPLANT
HEMOSTAT SPONGE AVITENE ULTRA (HEMOSTASIS) IMPLANT
IV CATH 14GX2 1/4 (CATHETERS) ×2 IMPLANT
KIT BASIN OR (CUSTOM PROCEDURE TRAY) ×2 IMPLANT
KIT POSITIONING SURG ANDREWS (MISCELLANEOUS) ×2 IMPLANT
MANIFOLD NEPTUNE II (INSTRUMENTS) ×2 IMPLANT
NEEDLE SPNL 18GX3.5 QUINCKE PK (NEEDLE) ×4 IMPLANT
PACK LAMINECTOMY ORTHO (CUSTOM PROCEDURE TRAY) ×2 IMPLANT
PATTIES SURGICAL .5 X.5 (GAUZE/BANDAGES/DRESSINGS) IMPLANT
PATTIES SURGICAL .75X.75 (GAUZE/BANDAGES/DRESSINGS) ×2 IMPLANT
PATTIES SURGICAL 1X1 (DISPOSABLE) IMPLANT
RUBBERBAND STERILE (MISCELLANEOUS) ×4 IMPLANT
SPONGE SURGIFOAM ABS GEL 100 (HEMOSTASIS) ×2 IMPLANT
STAPLER VISISTAT (STAPLE) IMPLANT
STRIP CLOSURE SKIN 1/2X4 (GAUZE/BANDAGES/DRESSINGS) ×2 IMPLANT
SUT NURALON 4 0 TR CR/8 (SUTURE) IMPLANT
SUT PROLENE 3 0 PS 2 (SUTURE) ×2 IMPLANT
SUT VIC AB 1 CT1 27 (SUTURE) ×1
SUT VIC AB 1 CT1 27XBRD ANTBC (SUTURE) ×1 IMPLANT
SUT VIC AB 1-0 CT2 27 (SUTURE) ×2 IMPLANT
SUT VIC AB 2-0 CT1 27 (SUTURE)
SUT VIC AB 2-0 CT1 TAPERPNT 27 (SUTURE) IMPLANT
SUT VIC AB 2-0 CT2 27 (SUTURE) ×2 IMPLANT
SYR 3ML LL SCALE MARK (SYRINGE) ×2 IMPLANT
TOWEL OR 17X26 10 PK STRL BLUE (TOWEL DISPOSABLE) ×2 IMPLANT
TOWEL OR NON WOVEN STRL DISP B (DISPOSABLE) ×2 IMPLANT
YANKAUER SUCT BULB TIP NO VENT (SUCTIONS) ×2 IMPLANT

## 2016-10-02 NOTE — Anesthesia Procedure Notes (Signed)
Procedure Name: Intubation Date/Time: 10/02/2016 8:41 AM Performed by: Jarvis NewcomerARMISTEAD, Tlaloc Taddei A Pre-anesthesia Checklist: Patient identified, Timeout performed, Emergency Drugs available, Suction available and Patient being monitored Patient Re-evaluated:Patient Re-evaluated prior to inductionOxygen Delivery Method: Circle system utilized Preoxygenation: Pre-oxygenation with 100% oxygen Intubation Type: IV induction Ventilation: Mask ventilation without difficulty and Oral airway inserted - appropriate to patient size Laryngoscope Size: Glidescope and 4 Grade View: Grade I Tube type: Glide rite Tube size: 7.5 mm Number of attempts: 3 Airway Equipment and Method: Video-laryngoscopy Placement Confirmation: ETT inserted through vocal cords under direct vision,  positive ETCO2 and breath sounds checked- equal and bilateral Secured at: 24 cm Tube secured with: Tape Dental Injury: Teeth and Oropharynx as per pre-operative assessment  Difficulty Due To: Difficult Airway- due to large tongue and Difficulty was unanticipated Future Recommendations: Recommend- induction with short-acting agent, and alternative techniques readily available Comments: Easy mask. DL X1 by CRNA. Unable to visualize vocal cords. Grade 3-4 view. Masked. VSS. DL X 1 by Dr. Acey Lavarignan. Grade 3-4 view. Boogie used to place ETT. - ETCO2.Marland Kitchen. ETT removed. Bagging with 100% O2. Glidescope present. DL X1 by Dr. Acey Lavarignan with #4 Glidescope blade. Grade 1 view. + ETCO2, BBS=. ATOI. ETT secured at 24 at the lip.

## 2016-10-02 NOTE — Discharge Instructions (Signed)
Walk As Tolerated utilizing back precautions.  No bending, twisting, or lifting.  No driving for 2 weeks.   °Aquacel dressing may remain in place until follow up. May shower with aquacel dressing in place. If the dressing peels off or becomes saturated, you may remove aquacel dressing and place gauze and tape dressing which should be kept clean and dry and changed daily. Do not remove steri-strips if they are present. °See Dr. Soua Caltagirone in office in 10 to 14 days. Begin taking aspirin 81mg per day starting 4 days after your surgery if not allergic to aspirin or on another blood thinner. °Walk daily even outside. Use a cane or walker only if necessary. °Avoid sitting on soft sofas. ° °

## 2016-10-02 NOTE — Anesthesia Postprocedure Evaluation (Signed)
Anesthesia Post Note  Patient: [name] Wood  Procedure(s) Performed: Procedure(s) (LRB): Microlumbar decompression L4-5 Left, L5-S1 Left (Left)  Patient location during evaluation: PACU Anesthesia Type: General Level of consciousness: awake and alert Pain management: pain level controlled Vital Signs Assessment: post-procedure vital signs reviewed and stable Respiratory status: spontaneous breathing, nonlabored ventilation, respiratory function stable and patient connected to nasal cannula oxygen Cardiovascular status: blood pressure returned to baseline and stable Postop Assessment: no signs of nausea or vomiting Anesthetic complications: no       Last Vitals:  Vitals:   10/02/16 1240 10/02/16 1322  BP: (!) 150/82 (!) 160/82  Pulse: 70 70  Resp: 15 16  Temp: 36.9 C 36.9 C    Last Pain:  Vitals:   10/02/16 1322  TempSrc: Axillary  PainSc:                  [name], [name]

## 2016-10-02 NOTE — Brief Op Note (Signed)
10/02/2016  10:31 AM  PATIENT:  [name] Wood  61 y.o. male  PRE-OPERATIVE DIAGNOSIS:  Stenosis, HNP L4-5  POST-OPERATIVE DIAGNOSIS:  Stenosis, HNP L4-5  PROCEDURE:  Procedure(s): Microlumbar decompression L4-5 Left, L5-S1 Left (Left)  SURGEON:  Surgeon(s) and Role:    * Jene EveryJeffrey Armaan Pond, MD - Primary  PHYSICIAN ASSISTANT:   ASSISTANTS: Bissell   ANESTHESIA:   general  EBL:  Total I/O In: 1000 [I.V.:1000] Out: 250 [Urine:200; Blood:50]  BLOOD ADMINISTERED:none  DRAINS: none   LOCAL MEDICATIONS USED:  MARCAINE     SPECIMEN:  Source of Specimen:  [id number]  DISPOSITION OF SPECIMEN:  PATHOLOGY  COUNTS:  YES  TOURNIQUET:  * No tourniquets in log *  DICTATION: .Other Dictation: Dictation Number  [id number]  PLAN OF CARE: Admit for overnight observation  PATIENT DISPOSITION:  PACU - hemodynamically stable.   Delay start of Pharmacological VTE agent (>24hrs) due to surgical blood loss or risk of bleeding: yes

## 2016-10-02 NOTE — Care Management Note (Signed)
Case Management Note  Patient Details  Name: [name] Wood MRN: [id number] Date of Birth: Oct 19, 1955  Subjective/Objective:                  Microlumbar decompression L4-5 Left, L5-S1 Left (Left) Action/Plan: Discharge planning Expected Discharge Date:                  Expected Discharge Plan:  Home/Self Care  In-House Referral:     Discharge planning Services  CM Consult  Post Acute Care Choice:  Durable Medical Equipment Choice offered to:  Patient  DME Arranged:  3-N-1, Walker rolling DME Agency:  [place]:  NA [place] Agency:  NA  Status of Service:  Completed, signed off  If discussed at [place] of Stay Meetings, dates discussed:    Additional Comments: CM met with pt to assess home needs and notified [facility] DME rep, Joelene Millin to please deliver the rolling walker and 3n1 to room prior to discharge today. No other CM needs were communicated. Dellie Catholic, RN 10/02/2016, 1:09 PM

## 2016-10-02 NOTE — Anesthesia Preprocedure Evaluation (Addendum)
Anesthesia Evaluation  Patient identified by MRN, date of birth, ID band Patient awake    Reviewed: Allergy & Precautions, NPO status , Patient's Chart, lab work & pertinent test results  Airway Mallampati: II  TM Distance: >3 FB Neck ROM: Full    Dental no notable dental hx.    Pulmonary sleep apnea and Continuous Positive Airway Pressure Ventilation , former smoker,    Pulmonary exam normal breath sounds clear to auscultation       Cardiovascular negative cardio ROS Normal cardiovascular exam Rhythm:Regular Rate:Normal     Neuro/Psych negative neurological ROS  negative psych ROS   GI/Hepatic negative GI ROS, Neg liver ROS,   Endo/Other  negative endocrine ROS  Renal/GU negative Renal ROS  negative genitourinary   Musculoskeletal negative musculoskeletal ROS (+)   Abdominal   Peds negative pediatric ROS (+)  Hematology negative hematology ROS (+)   Anesthesia Other Findings   Reproductive/Obstetrics negative OB ROS                            Anesthesia Physical Anesthesia Plan  ASA: II  Anesthesia Plan: General   Post-op Pain Management:    Induction: Intravenous  Airway Management Planned: Oral ETT  Additional Equipment:   Intra-op Plan:   Post-operative Plan: Extubation in OR  Informed Consent: I have reviewed the patients History and Physical, chart, labs and discussed the procedure including the risks, benefits and alternatives for the proposed anesthesia with the patient or authorized representative who has indicated his/her understanding and acceptance.   Dental advisory given  Plan Discussed with: CRNA  Anesthesia Plan Comments:         Anesthesia Quick Evaluation

## 2016-10-02 NOTE — Evaluation (Signed)
Occupational Therapy Evaluation Patient Details Name: [name] PainGregory M Delpilar MRN: [id number] DOB: 01/30/1956 Today's Date: 10/02/2016    History of Present Illness s/p L4-5 decompression   Clinical Impression   This 61 year old man was admitted for the above sx. All education was completed. No further OT is needed at this time    Follow Up Recommendations  No OT follow up    Equipment Recommendations  3 in 1 bedside commode (delivered)    Recommendations for Other Services       Precautions / Restrictions Precautions Precautions: Back Restrictions Weight Bearing Restrictions: No      Mobility Bed Mobility Overal bed mobility: Modified Independent                Transfers Overall transfer level: Modified independent                    Balance                                            ADL Overall ADL's : Needs assistance/impaired             Lower Body Bathing: Moderate assistance;Sit to/from stand       Lower Body Dressing: Moderate assistance;Sit to/from stand   Toilet Transfer: Supervision/safety;Ambulation;RW (simulated with bed)             General ADL Comments: pt is able to perform UB adls with set up.  Wife plans to get him a reacher, in case he is alone and pants slide down.  Reviewed toilet aide, if needed.  Wife will be available to assist as needed. Reviewed all precautions and ADL modification options.  Wife had several questions about chair options.  pt wants to sleep in his own bed:  she is concerned that he will twist with rolling.  Recommended she tri-fold a sheet to place under him and assist, as needed.  Pt stood in bathroom with urinal at mod I level     Vision     Perception     Praxis      Pertinent Vitals/Wood Wood Assessment: 0-10 Wood Score: 5  Wood Location: incision Wood Descriptors / Indicators: Sore Wood Intervention(s): Limited activity within patient's tolerance;Monitored during  session;Premedicated before session;Repositioned     Hand Dominance     Extremity/Trunk Assessment Upper Extremity Assessment Upper Extremity Assessment: Overall WFL for tasks assessed           Communication Communication Communication: No difficulties   Cognition Arousal/Alertness: Awake/alert Behavior During Therapy: WFL for tasks assessed/performed Overall Cognitive Status: Within Functional Limits for tasks assessed                     General Comments       Exercises       Shoulder Instructions      Home Living Family/patient expects to be discharged to:: Private residence Living Arrangements: Spouse/significant other Available Help at Discharge: Family               Bathroom Shower/Tub: Walk-in Human resources officershower   Bathroom Toilet: Standard     Home Equipment: None   Additional Comments: 3:1 and RW delivered to room      Prior Functioning/Environment Level of Independence: Independent                 OT Problem  List:     OT Treatment/Interventions:      OT Goals(Current goals can be found in the care plan section) Acute Rehab OT Goals Patient Stated Goal: return to independence OT Goal Formulation: All assessment and education complete, DC therapy  OT Frequency:     Barriers to D/C:            Co-evaluation              End of Session    Activity Tolerance: Patient tolerated treatment well Patient left: in bed;with call bell/phone within reach;with family/visitor present   Time: 1610-9604 OT Time Calculation (min): 21 min Charges:  OT General Charges $OT Visit: 1 Procedure OT Evaluation $OT Eval Low Complexity: 1 Procedure G-Codes: OT G-codes **NOT FOR INPATIENT CLASS** Functional Assessment Tool Used: clinical observation and judgment Functional Limitation: Self care Self Care Current Status (V4098): At least 40 percent but less than 60 percent impaired, limited or restricted Self Care Goal Status (J1914): At least  40 percent but less than 60 percent impaired, limited or restricted Self Care Discharge Status [phone]): At least 40 percent but less than 60 percent impaired, limited or restricted  Aleayah Chico 10/02/2016, 4:19 PM  Marica Otter, OTR/L [phone] 10/02/2016

## 2016-10-02 NOTE — Interval H&P Note (Signed)
History and Physical Interval Note:  10/02/2016 8:23 AM  [name]  has presented today for surgery, with the diagnosis of Stenosis, HNP L4-5  The various methods of treatment have been discussed with the patient and family. After consideration of risks, benefits and other options for treatment, the patient has consented to  Procedure(s): Microlumbar decompression L4-5 Left (Left) as a surgical intervention .  The patient's history has been reviewed, patient examined, no change in status, stable for surgery.  I have reviewed the patient's chart and labs.  Questions were answered to the patient's satisfaction.     Lilla Callejo C

## 2016-10-02 NOTE — H&P (View-Only) (Signed)
[name] Wood is an 61 y.o. male.   Chief Complaint: back and left leg pain HPI: The patient is a 61 year old male who presents today for follow up of their back. The patient is being followed for their low back symptoms. They are now month(s) out from a flare up. Symptoms reported today include: pain. Current treatment includes: physical therapy, activity modification and pain medications. The following medication has been used for pain control: gabapentin. The patient presents today following physical therapy.  [name] Wood follows up after physical therapy. He continues to have significant pain radiating down into his leg. This is now over six months in duration. He had an epidural, periodically giving him temporary relief at L4-L5 on the left. Unfortunately, recently it was temporary. He was seen in physical therapy by [name] Wood and he is having pain. It helps him for about an hour then the pain returns, both with sitting and standing and walking. He is taking gabapentin.  Review of systems is negative for fevers, chest pain, shortness of breath, unexplained recent weight loss, loss of bowel or bladder function, burning with urination, joint swelling, rashes, weakness or numbness, difficulty with balance, easy bruising, excessive thirst or frequent urination.  He reports he is otherwise healthy. No chest pain, shortness of breath.  Past Medical Hx Hyperlipidemia  No past surgical history on file.  No family history on file. Social History:  reports that he has quit smoking. He does not have any smokeless tobacco history on file. He reports that he drinks about 0.5 oz of alcohol per week . His drug history is not on file.  Allergies: NKDA  Review of Systems  Constitutional: Negative.   HENT: Negative.   Eyes: Negative.   Respiratory: Negative.   Cardiovascular: Negative.   Gastrointestinal: Negative.   Genitourinary: Negative.   Musculoskeletal: Positive for back pain.  Skin:  Negative.   Neurological: Positive for sensory change and focal weakness.  Psychiatric/Behavioral: Negative.     There were no vitals taken for this visit. Physical Exam  Constitutional: He is oriented to person, place, and time. He appears well-developed. He appears distressed.  HENT:  Head: Normocephalic.  Eyes: Pupils are equal, round, and reactive to light.  Neck: Normal range of motion.  Cardiovascular: Normal rate.   Respiratory: Effort normal.  GI: Soft.  Musculoskeletal:  He is upright, mild distress. Mood and affect is appropriate. His straight leg raise on the left buttock, thigh, and calf pain. On the right it is negative. EHL is still 4+/5 on the left compared to the right, slightly altered sensation in S1 dermatome.  Lumbar spine exam reveals no evidence of soft tissue swelling, deformity or skin ecchymosis. On palpation there is no tenderness of the lumbar spine. No flank pain with percussion. The abdomen is soft and nontender. Nontender over the trochanters. No cellulitis or lymphadenopathy.  Good range of motion of the lumbar spine without associated pain. Motor is 5/5 including tibialis anterior, plantar flexion, quadriceps and hamstrings. Patient is normoreflexic. There is no Babinski or clonus. Sensory exam is intact to light touch. Patient has good distal pulses. No DVT. No pain and normal range of motion without instability of the hips, knees and ankles.  Neurological: He is alert and oriented to person, place, and time.    MRI demonstrates multilevel disc degeneration 3-4, 4-5, and 5-1. He has a disc protrusion at 3-4, mild lateral recess stenosis. He has no L3-L4 radicular pain. Extruded disk herniation with disk osteophyte  complex at 4-5 with severe lateral recess stenosis, affecting the L5 root. At L5-S1, he has a central disk protrusion not affecting the S1 nerve root. He has no S1 nerve root symptoms. On his patient's pain drawing today, he has specific L5 radicular  dermatomal pain.  Assessment/Plan L5 radicular pain secondary to disc herniation, disc osteophyte complex and spinal stenosis at L4-L5, spacing the L5 root with myotomal weakness, dermatomal dysesthesia, despite rest, activity modification, epidural, physical therapy.  We extensively discussed concerning current pathology, relevant anatomy, treatment options, living with his symptoms versus microlumbar decompression at 4-5, decompressing L5 root.  I had an extensive discussion of the risks and benefits of the lumbar decompression with the patient including bleeding, infection, damage to neurovascular structures, epidural fibrosis, CSF leak requiring repair. We also discussed increase in pain, adjacent segment disease, recurrent disc herniation, need for future surgery including repeat decompression and/or fusion. We also discussed risks of postoperative hematoma, paralysis, anesthetic complications including DVT, PE, death, cardiopulmonary dysfunction. In addition, the perioperative and postoperative courses were discussed in detail including the rehabilitative time and return to functional activity and work. I provided the patient with an illustrated handout and utilized the appropriate surgical models.  Specifically discussed decompressing the nerve root to allow it to heal which can take a period of time given the duration of neural compression and residual neurologic deficit is certainly possible. Hopefully, though with the decompression and continued PT, the myotomal weakness will improve. We discussed the hospital course postoperative course in detail. No history of DVT, MRSA exposure, chest pain, shortness of breath, stroke, or diabetes. Out of work up to 12 weeks, possible return to light duty in two weeks. We will proceed with scheduling.  Plan microlumbar decompression L4-5 left  [name] Wood, [name] Wood M., PA-C for Dr. Shelle IronBeane 09/13/2016, 11:35 AM

## 2016-10-02 NOTE — Evaluation (Signed)
Physical Therapy Evaluation Patient Details Name: [name] Wood MRN: [id number] DOB: 1956/06/22 Today's Date: 10/02/2016   History of Present Illness  s/p L4-5 decompression  Clinical Impression  Pt s/p back surgery and presents with functional mobility limitations 2* post op pain and back precautions.  Pt mobilizing at Sup level and plans dc home this date with assist of family.    Follow Up Recommendations No PT follow up    Equipment Recommendations  Rolling walker with 5" wheels    Recommendations for Other Services OT consult     Precautions / Restrictions Precautions Precautions: Back Precaution Booklet Issued: Yes (comment) Precaution Comments: All precautions reviewed x 2 Restrictions Weight Bearing Restrictions: No      Mobility  Bed Mobility Overal bed mobility: Needs Assistance Bed Mobility: Supine to Sit     Supine to sit: Min guard     General bed mobility comments: cues for correct log roll technique and adherence to back precautions  Transfers Overall transfer level: Needs assistance Equipment used: None Transfers: Sit to/from Stand Sit to Stand: Min guard         General transfer comment: cues for transition position, LE placement, use of UEs to self assist and adherence to back precautions  Ambulation/Gait Ambulation/Gait assistance: Min guard;Supervision Ambulation Distance (Feet): 300 Feet Assistive device: Rolling walker (2 wheeled);None Gait Pattern/deviations: Step-through pattern;Decreased step length - right;Decreased step length - left;Shuffle Gait velocity: decr Gait velocity interpretation: Below normal speed for age/gender General Gait Details: 250' with RW and final 3850' sans AD; Pt with good safety awareness  Stairs Stairs: Yes Stairs assistance: Min assist;Min guard Stair Management: No rails;One rail Right;Step to pattern;Forwards;With walker Number of Stairs: 6 General stair comments: up/down 4 stairs with single rail.   Down 2 stairs with RW fwd.  Cues for sequence   Wheelchair Mobility    Modified Rankin (Stroke Patients Only)       Balance                                             Pertinent Vitals/Pain Pain Assessment: 0-10 Pain Score: 5  Pain Location: incision Pain Descriptors / Indicators: Sore Pain Intervention(s): Limited activity within patient's tolerance;Monitored during session;Premedicated before session    Home Living Family/patient expects to be discharged to:: Private residence Living Arrangements: Spouse/significant other Available Help at Discharge: Family Type of Home: House Home Access: Stairs to enter Entrance Stairs-Rails: Right;Left;None;Can reach both Secretary/administratorntrance Stairs-Number of Steps: 6 Home Layout: Two level Home Equipment: None Additional Comments: 3:1 and RW delivered to room; 6 stairs down from drive to house.  FIrst 2-3 without rails    Prior Function Level of Independence: Independent               Hand Dominance        Extremity/Trunk Assessment   Upper Extremity Assessment Upper Extremity Assessment: Overall WFL for tasks assessed    Lower Extremity Assessment Lower Extremity Assessment: Overall WFL for tasks assessed       Communication   Communication: No difficulties  Cognition Arousal/Alertness: Awake/alert Behavior During Therapy: WFL for tasks assessed/performed Overall Cognitive Status: Within Functional Limits for tasks assessed                      General Comments      Exercises     Assessment/Plan  PT Assessment Patent does not need any further PT services  PT Problem List Decreased activity tolerance;Decreased mobility;Decreased knowledge of use of DME;Pain;Decreased knowledge of precautions          PT Treatment Interventions DME instruction;Gait training;Stair training;Functional mobility training;Therapeutic activities;Patient/family education    PT Goals (Current goals can be  found in the Care Plan section)  Acute Rehab PT Goals Patient Stated Goal: return to independence PT Goal Formulation: All assessment and education complete, DC therapy    Frequency Min 1X/week   Barriers to discharge        Co-evaluation               End of Session   Activity Tolerance: Patient tolerated treatment well Patient left: in bed (EOB with OT) Nurse Communication: Mobility status    Functional Assessment Tool Used: Clinical judgement Functional Limitation: Mobility: Walking and moving around Mobility: Walking and Moving Around Current Status (Z6109): At least 1 percent but less than 20 percent impaired, limited or restricted Mobility: Walking and Moving Around Goal Status [phone]): At least 1 percent but less than 20 percent impaired, limited or restricted Mobility: Walking and Moving Around Discharge Status [phone]): At least 1 percent but less than 20 percent impaired, limited or restricted    Time: 9147-8295 PT Time Calculation (min) (ACUTE ONLY): 28 min   Charges:   PT Evaluation $PT Eval Low Complexity: 1 Procedure PT Treatments $Gait Training: 8-22 mins   PT G Codes:   PT G-Codes **NOT FOR INPATIENT CLASS** Functional Assessment Tool Used: Clinical judgement Functional Limitation: Mobility: Walking and moving around Mobility: Walking and Moving Around Current Status (A2130): At least 1 percent but less than 20 percent impaired, limited or restricted Mobility: Walking and Moving Around Goal Status [phone]): At least 1 percent but less than 20 percent impaired, limited or restricted Mobility: Walking and Moving Around Discharge Status [phone]): At least 1 percent but less than 20 percent impaired, limited or restricted    [facility] 10/02/2016, 5:37 PM

## 2016-10-02 NOTE — Transfer of Care (Signed)
Immediate Anesthesia Transfer of Care Note  Patient: [name] PainGregory M Chisholm  Procedure(s) Performed: Procedure(s): Microlumbar decompression L4-5 Left, L5-S1 Left (Left)  Patient Location: PACU  Anesthesia Type:General  Level of Consciousness: awake, alert , oriented and patient cooperative  Airway & Oxygen Therapy: Patient Spontanous Breathing and Patient connected to face mask oxygen  Post-op Assessment: Report given to RN, Post -op Vital signs reviewed and stable and Patient moving all extremities  Post vital signs: Reviewed and stable  Last Vitals:  Vitals:   10/02/16 0643  BP: (!) 155/75  Pulse: 67  Resp: 18  Temp: 36.4 C    Last Wood:  Vitals:   10/02/16 0708  TempSrc:   PainSc: 5       Patients Stated Wood Goal: 4 (10/02/16 0708)  Complications: No apparent anesthesia complications

## 2016-10-02 NOTE — Discharge Summary (Signed)
Physician Discharge Summary   Patient ID: [name] Wood MRN: [id number] DOB/AGE: 1956-07-22 61 y.o.  Admit date: 10/02/2016 Discharge date: 10/02/2016  Primary Diagnosis:   Stenosis, HNP L4-5  Admission Diagnoses:  Past Medical History:  Diagnosis Date  . Sleep apnea    use CPAP   Discharge Diagnoses:   Principal Problem:   Spinal stenosis of lumbar region  Procedure:  Procedure(s) (LRB): Microlumbar decompression L4-5 Left, L5-S1 Left (Left)   Consults: None  HPI:  see H&P    Laboratory Data: Hospital Outpatient Visit on 09/24/2016  Component Date Value Ref Range Status  . Sodium 09/24/2016 139  135 - 145 mmol/L Final  . Potassium 09/24/2016 4.6  3.5 - 5.1 mmol/L Final  . Chloride 09/24/2016 108  101 - 111 mmol/L Final  . CO2 09/24/2016 24  22 - 32 mmol/L Final  . Glucose, Bld 09/24/2016 103* 65 - 99 mg/dL Final  . BUN 09/24/2016 24* 6 - 20 mg/dL Final  . Creatinine, Ser 09/24/2016 0.87  0.61 - 1.24 mg/dL Final  . Calcium 09/24/2016 9.1  8.9 - 10.3 mg/dL Final  . GFR calc non Af Amer 09/24/2016 >60  >60 mL/min Final  . GFR calc Af Amer 09/24/2016 >60  >60 mL/min Final   Comment: (NOTE) The eGFR has been calculated using the CKD EPI equation. This calculation has not been validated in all clinical situations. eGFR's persistently <60 mL/min signify possible Chronic Kidney Disease.   . Anion gap 09/24/2016 7  5 - 15 Final  . WBC 09/24/2016 9.1  4.0 - 10.5 K/uL Final  . RBC 09/24/2016 4.32  4.22 - 5.81 MIL/uL Final  . Hemoglobin 09/24/2016 14.1  13.0 - 17.0 g/dL Final  . HCT 09/24/2016 39.4  39.0 - 52.0 % Final  . MCV 09/24/2016 91.2  78.0 - 100.0 fL Final  . MCH 09/24/2016 32.6  26.0 - 34.0 pg Final  . MCHC 09/24/2016 35.8  30.0 - 36.0 g/dL Final  . RDW 09/24/2016 11.8  11.5 - 15.5 % Final  . Platelets 09/24/2016 210  150 - 400 K/uL Final  . MRSA, PCR 09/24/2016 NEGATIVE  NEGATIVE Final  . Staphylococcus aureus 09/24/2016 NEGATIVE  NEGATIVE Final   Comment:         The Xpert SA Assay (FDA approved for NASAL specimens in patients over 26 years of age), is one component of a comprehensive surveillance program.  Test performance has been validated by [facility] for patients greater than or equal to 67 year old. It is not intended to diagnose infection nor to guide or monitor treatment.    No results for input(s): HGB in the last 72 hours. No results for input(s): WBC, RBC, HCT, PLT in the last 72 hours. No results for input(s): NA, K, CL, CO2, BUN, CREATININE, GLUCOSE, CALCIUM in the last 72 hours. No results for input(s): LABPT, INR in the last 72 hours.  X-Rays:Dg Lumbar Spine 2-3 Views  Result Date: 09/24/2016 CLINICAL DATA:  Preop for lumbar spine surgery, low back pain extending to the left lower extremity EXAM: LUMBAR SPINE - 2-3 VIEW COMPARISON:  None. FINDINGS: The lumbar vertebrae are in normal alignment. There is degenerative disc disease at L4-5 and to a lesser degree at L5-S1 where there is loss of disc space and mild spurring. The remainder of intervertebral disc spaces appear normal. No compression deformity is seen. The SI joints are corticated. Moderate abdominal aortic atherosclerosis is present. IMPRESSION: Normal alignment with degenerative disc disease at L4-5  and L5-S1. Moderate abdominal aortic atherosclerosis. Electronically Signed   By: Ivar Drape M.D.   On: 09/24/2016 16:34   Dg Spine Portable 1 View  Result Date: 10/02/2016 CLINICAL DATA:  Lumbar decompression EXAM: PORTABLE SPINE - 1 VIEW COMPARISON:  Studies obtained earlier in the day FINDINGS: Cross-table lateral lumbar image labeled #3 submitted. There is a metallic probe tip overlying the L4-5 interspace from a posterior approach. A second metallic probe has its tip immediately posterior to the L5-S1 interspace. There is moderate disc space narrowing at L4-5 L5-S1. The disc spaces appear unremarkable. No fracture or spondylolisthesis. There is aortic atherosclerosis.  IMPRESSION: Metallic probe tips are noted with a metallic probe with tip overlying the posterior aspect of the L4-5 interspace and a second probe with tip just posterior to the L5-S1 interspace. Disc space narrowing L4-5 and L5-S1. There is aortic atherosclerosis. No fracture or spondylolisthesis. Electronically Signed   By: Lowella Grip III M.D.   On: 10/02/2016 10:26   Dg Spine Portable 1 View  Result Date: 10/02/2016 CLINICAL DATA:  Intraoperative localization EXAM: PORTABLE SPINE - 1 VIEW COMPARISON:  09/24/2016 FINDINGS: Posterior surgical instruments project over the L4-5 interspace with a localizing instrument directed at the L5 pedicles. IMPRESSION: Intraoperative localization as above. Electronically Signed   By: Rolm Baptise M.D.   On: 10/02/2016 09:27   Dg Spine Portable 1 View  Result Date: 10/02/2016 CLINICAL DATA:  61 year old male undergoing lumbar surgery. Initial encounter. EXAM: PORTABLE SPINE - 1 VIEW COMPARISON:  Preoperative lumbar radiographs 09/24/2016. FINDINGS: Normal lumbar segmentation demonstrated on the comparison. Intraoperative portable cross-table lateral view of the lumbar spine labeled image #1 at 0854 hours. There is a posterior approach needle directed at the L4 spinous process, and a second needle directed at the L5 spinous process. Calcified aortic atherosclerosis. IMPRESSION: Posterior needles directed at the L4 and L5 spinous processes. Calcified aortic atherosclerosis also noted. Electronically Signed   By: Genevie Ann M.D.   On: 10/02/2016 09:27    EKG:No orders found for this or any previous visit.   Hospital Course: Patient was admitted to [facility] and taken to the OR and underwent the above state procedure without complications.  Patient tolerated the procedure well and was later transferred to the recovery room and then to the orthopaedic floor for postoperative care.  They were given PO and IV analgesics for pain control following their surgery.   They were given postoperative antibiotics.   PT was consulted postop to assist with mobility and transfers.  The patient was allowed to be WBAT with therapy and was taught back precautions. Discharge planning was consulted to help with postop disposition and equipment needs.  Patient had good pain control started to get up OOB same day as surgery. Patient was ready to go home the same day of surgery.  They were given discharge instructions and dressing directions.  They were instructed on when to follow up in the office with Dr. Tonita Cong.   Diet: Regular diet Activity:WBAT Follow-up:in 10-14 days Disposition - Home Discharged Condition: good   Discharge Instructions    Call MD / Call 911    Complete by:  As directed    If you experience chest pain or shortness of breath, CALL 911 and be transported to the hospital emergency room.  If you develope a fever above 101 F, pus (white drainage) or increased drainage or redness at the wound, or calf pain, call your surgeon's office.   Constipation Prevention  Complete by:  As directed    Drink plenty of fluids.  Prune juice may be helpful.  You may use a stool softener, such as Colace (over the counter) 100 mg twice a day.  Use MiraLax (over the counter) for constipation as needed.   Diet - low sodium heart healthy    Complete by:  As directed    Increase activity slowly as tolerated    Complete by:  As directed      Allergies as of 10/02/2016   No Known Allergies     Medication List    TAKE these medications   docusate sodium 100 MG capsule Commonly known as:  COLACE Take 1 capsule (100 mg total) by mouth 2 (two) times daily. Script sent to pharmacy 10/01/16   EMERGEN-C VITAMIN C Pack Take 1 packet by mouth daily.   ibuprofen 200 MG tablet Commonly known as:  ADVIL,MOTRIN Take 2 tablets (400 mg total) by mouth daily as needed for headache or moderate pain. Resume as needed 5 days post-op What changed:  additional instructions     methocarbamol 500 MG tablet Commonly known as:  ROBAXIN Take 1 tablet (500 mg total) by mouth every 6 (six) hours as needed for muscle spasms. Script sent to pharmacy 10/01/16   multivitamin with minerals Tabs tablet Take 1 tablet by mouth daily.   oxyCODONE-acetaminophen 5-325 MG tablet Commonly known as:  PERCOCET/ROXICET Take 1 tablet by mouth every 8 (eight) hours as needed for severe pain.   polyethylene glycol packet Commonly known as:  MIRALAX / GLYCOLAX Take 17 g by mouth daily as needed for mild constipation. Script sent to pharmacy 10/01/16   pravastatin 40 MG tablet Commonly known as:  PRAVACHOL Take 40 mg by mouth every evening.            Durable Medical Equipment        Start     Ordered   10/02/16 1309  For home use only DME Walker rolling  Once    Question:  Patient needs a walker to treat with the following condition  Answer:  Surgery, elective   10/02/16 1310   10/02/16 1308  For home use only DME 3 n 1  Once     10/02/16 1310     Follow-up Information    BEANE,JEFFREY C, MD Follow up in 2 week(s).   Specialty:  Orthopedic Surgery Contact information: [address] [place] [address] [phone]        Inc[facility] - [facility] Follow up.   Why:  rolling walker and 3n1 Contact information: [place] [address] [phone]           Signed: Lacie Draft, PA-C Orthopaedic Surgery 10/02/2016, 3:22 PM

## 2016-10-03 ENCOUNTER — Encounter (HOSPITAL_COMMUNITY): Payer: Self-pay | Admitting: Specialist

## 2016-10-03 NOTE — Op Note (Signed)
[name]:  Wood, [name]              ACCOUNT NO.:  [id number]  MEDICAL RECORD NO.:  [id number]  LOCATION:                                 FACILITY:  PHYSICIAN:  Jene EveryJeffrey Shalona Harbour, M.D.    DATE OF BIRTH:  Feb 28, 1956  DATE OF PROCEDURE:  10/02/2016 DATE OF DISCHARGE:                              OPERATIVE REPORT   PREOPERATIVE DIAGNOSIS:  Spinal stenosis herniated nucleus pulposus at L4-5.  POSTOPERATIVE DIAGNOSIS:  Spinal stenosis herniated nucleus pulposus at L4-5.  PROCEDURE: 1. Microlumbar decompression L4-5 and L5-S1, left. 2. Hemilaminectomy L5, left. 3. Foraminotomies L4-L5, left. 4. Microdiskectomy L4-5, left.  ANESTHESIA:  General.  ASSISTANT:  Andrez GrimeJaclyn Bissell, PA.  SPECIMEN:  L4-5 disk herniation to pathology.  HISTORY:  This is a 61 year old gentleman who has had acute-on-chronic L5 radiculopathy, more recently myotomal weakness, dermatomal dysesthesias for active rest, activity modification, therapy, and injections.  He had persistent EHL weakness with a disk degeneration of L4-5 and at L5-S1.  He had severe lateral recess stenosis by MRI and foraminal stenosis of L4.  Disc herniation and progressive disc degeneration.  He was indicated for decompression of the 5 root and of the L4-5 space.  We did discuss the possibility of requiring a decompression at L5-S1 and mobilize the hemi lamina to fully decompress the L5 nerve root.  Risk and benefits discussed including bleeding, infection, damage to the neurovascular structures, no change in symptoms, worsening symptoms, DVT, PE, anesthetic complications, persistent back pain, need for fusion the future, etc.  TECHNIQUE:  With the patient in supine position, after induction of adequate general anesthesia and 2 g Kefzol, Foley to gravity.  Placed prone on the [place] frame.  All bony prominences were well padded. Foley to gravity.  Lumbar region was prepped and draped in the usual sterile fashion.  Two 18-gauge spinal  needle were utilized to localize L4-5 interspace, confirmed with x-ray.  Incision was made from the spinous processes of L4 to just below L5.  Subcutaneous tissue was dissected.  Electrocautery utilized to achieve hemostasis.  A 0.25% Marcaine with epinephrine was infiltrated in the perimuscular tissue.  I divided in line with the skin incision and gently elevated the paraspinous musculature at L4-5 and L5-S1.  McCullough retractor was placed.  Operating microscope was draped and brought on the surgical field.  Confirmatory radiograph obtained.  A small interlaminar window was noted at L4-5 secondary to facet hypertrophy.  Detached ligamentum flavum from the cephalad edge of L4 utilizing micro curette, and performed hemilaminotomy of the caudad edge of the cephalad edge of 5. Hemilaminotomy of the caudad edge of 4 was performed with a 2 mm and 3 mm Kerrison to detach the ligamentum flavum from its caudad edge.  The pars was preserved.  Due to stenosis generous hemilaminotomy of L4 was performed.  Following this, we gently elevated the ligamentum flavum from the interspace.  Noted was the L5 root severely compressed in the lateral recess in the foramen of L5.  To mobilize the nerve root and allow tension and retrieval of the disk herniation, we initiated hemilaminotomy of L5.  Still severe compression of the L5 root was noted out into the foramen underneath the  lamina of L5.  This required a full hemilaminectomy and a full foraminotomy of L5 to fully decompress the L5 root and allow mobilization medially to retrieve the herniated disk without undue tension on the nerve root.  Ligamentum flavum also removed from the interspace at L5-S1.  No disk herniation noted at L5-S1.  I gently mobilized the L5 root medially.  This was again tented over the disk herniation at L5-S1.  I decompressed the lateral recess to the medial border of the pedicle and also performed a foraminotomy of L4 protecting  the L4 root at all times.  This was stenotic as well due to facet hypertrophy and disk degeneration.  A focal HNP was noted.  I performed an annulotomy and retrieved multiple fragments from the subannular space and that had migrated caudad beneath the posterior longitudinal ligament.  I then entered the disk space and mobilized further disk herniation retrieval with a straight micro pituitary, further mobilized and irrigated with catheter lavage and additional fragments and then retrieved.  Full diskectomy was performed, and following this, there was a 1 cm excursion of the L5 root medial pedicle without tension.  A neural probe passed freely up the foramen of L4-L5 and down at L5-S1 above the pedicle of L4.  We had well decompressed the stenosis at the foramen of L4 and L5 and the L5 root.  It erythematous and edematous, but intact.  Confirmatory radiograph obtained with a marker in the disk space.  Copiously irrigated with antibiotic irrigation.  Inspection revealed no CSF leakage or active bleeding.  We used bipolar cautery and bone wax to achieve hemostasis throughout the case, and thrombin-soaked Gelfoam.  Thrombin-soaked Gelfoam was then placed in laminotomy defect.  We felt the L5 root was well decompressed.  McCullough retractor was removed.  Paraspinous muscles inspected.  No active bleeding.  Copiously irrigated the wound.  We closed the dorsal lumbar fascia with 1 Vicryl, subcu with 2-0, and skin with Prolene. Sterile dressing applied.  Placed supine on the hospital bed, extubated without difficulty, and transported to the recovery room in satisfactory condition.  The patient tolerated the procedure well.  No complications.  Assistant, Lanna Poche, PA was used throughout the case for general intermittent neural traction, suction, closure, patient positioning.     Jene Every, M.D.   ______________________________ Jene Every, M.D.    [name]  D:   10/02/2016  T:  10/03/2016  Job:  [id number]

## 2016-11-06 DIAGNOSIS — M5432 Sciatica, left side: Secondary | ICD-10-CM | POA: Diagnosis not present

## 2016-11-11 DIAGNOSIS — M5432 Sciatica, left side: Secondary | ICD-10-CM | POA: Diagnosis not present

## 2016-11-15 DIAGNOSIS — M5432 Sciatica, left side: Secondary | ICD-10-CM | POA: Diagnosis not present

## 2016-11-21 DIAGNOSIS — M5432 Sciatica, left side: Secondary | ICD-10-CM | POA: Diagnosis not present

## 2016-11-26 DIAGNOSIS — M5432 Sciatica, left side: Secondary | ICD-10-CM | POA: Diagnosis not present

## 2016-11-28 DIAGNOSIS — M5432 Sciatica, left side: Secondary | ICD-10-CM | POA: Diagnosis not present

## 2016-12-11 DIAGNOSIS — G4733 Obstructive sleep apnea (adult) (pediatric): Secondary | ICD-10-CM | POA: Diagnosis not present

## 2017-03-04 DIAGNOSIS — R42 Dizziness and giddiness: Secondary | ICD-10-CM | POA: Diagnosis not present

## 2017-03-04 DIAGNOSIS — H8113 Benign paroxysmal vertigo, bilateral: Secondary | ICD-10-CM | POA: Diagnosis not present

## 2017-03-04 DIAGNOSIS — R2689 Other abnormalities of gait and mobility: Secondary | ICD-10-CM | POA: Diagnosis not present

## 2017-03-17 DIAGNOSIS — Z1389 Encounter for screening for other disorder: Secondary | ICD-10-CM | POA: Diagnosis not present

## 2017-03-17 DIAGNOSIS — R7301 Impaired fasting glucose: Secondary | ICD-10-CM | POA: Diagnosis not present

## 2017-03-17 DIAGNOSIS — E7800 Pure hypercholesterolemia, unspecified: Secondary | ICD-10-CM | POA: Diagnosis not present

## 2017-03-17 DIAGNOSIS — M5136 Other intervertebral disc degeneration, lumbar region: Secondary | ICD-10-CM | POA: Diagnosis not present

## 2017-05-07 DIAGNOSIS — G4733 Obstructive sleep apnea (adult) (pediatric): Secondary | ICD-10-CM | POA: Diagnosis not present

## 2017-06-24 DIAGNOSIS — Z0000 Encounter for general adult medical examination without abnormal findings: Secondary | ICD-10-CM | POA: Diagnosis not present

## 2017-07-08 DIAGNOSIS — E7800 Pure hypercholesterolemia, unspecified: Secondary | ICD-10-CM | POA: Diagnosis not present

## 2017-07-08 DIAGNOSIS — E119 Type 2 diabetes mellitus without complications: Secondary | ICD-10-CM | POA: Diagnosis not present

## 2017-07-08 DIAGNOSIS — Z0000 Encounter for general adult medical examination without abnormal findings: Secondary | ICD-10-CM | POA: Diagnosis not present

## 2017-07-18 DIAGNOSIS — Z1212 Encounter for screening for malignant neoplasm of rectum: Secondary | ICD-10-CM | POA: Diagnosis not present

## 2017-08-14 DIAGNOSIS — G4733 Obstructive sleep apnea (adult) (pediatric): Secondary | ICD-10-CM | POA: Diagnosis not present

## 2017-11-05 DIAGNOSIS — M545 Low back pain: Secondary | ICD-10-CM | POA: Diagnosis not present

## 2017-11-05 DIAGNOSIS — M5136 Other intervertebral disc degeneration, lumbar region: Secondary | ICD-10-CM | POA: Diagnosis not present

## 2017-11-05 DIAGNOSIS — M5416 Radiculopathy, lumbar region: Secondary | ICD-10-CM | POA: Diagnosis not present

## 2017-11-05 DIAGNOSIS — M48061 Spinal stenosis, lumbar region without neurogenic claudication: Secondary | ICD-10-CM | POA: Diagnosis not present

## 2017-11-07 DIAGNOSIS — M5136 Other intervertebral disc degeneration, lumbar region: Secondary | ICD-10-CM | POA: Diagnosis not present

## 2017-11-07 DIAGNOSIS — M48061 Spinal stenosis, lumbar region without neurogenic claudication: Secondary | ICD-10-CM | POA: Diagnosis not present

## 2017-11-07 DIAGNOSIS — M5416 Radiculopathy, lumbar region: Secondary | ICD-10-CM | POA: Diagnosis not present

## 2017-11-18 DIAGNOSIS — M5136 Other intervertebral disc degeneration, lumbar region: Secondary | ICD-10-CM | POA: Diagnosis not present

## 2017-11-18 DIAGNOSIS — M48061 Spinal stenosis, lumbar region without neurogenic claudication: Secondary | ICD-10-CM | POA: Diagnosis not present

## 2017-11-25 DIAGNOSIS — M5416 Radiculopathy, lumbar region: Secondary | ICD-10-CM | POA: Diagnosis not present

## 2017-12-02 ENCOUNTER — Ambulatory Visit: Payer: Self-pay | Admitting: Orthopedic Surgery

## 2017-12-03 NOTE — Pre-Procedure Instructions (Signed)
Lorna DibbleGregory M [facility]  12/03/2017      [facility] [place] [place] [address] Phone: [phone] Fax: [phone]    Your procedure is scheduled on  Thursday 12/11/17  [facility] at [facility]30 [facility].M.  Call this number if you have problems the morning of surgery:  [phone]   Remember:  Do not eat food or drink liquids after midnight.  Take these medicines the morning of surgery with A SIP OF WATER - NONE  7 days prior to surgery STOP taking any Aspirin(unless otherwise instructed by your surgeon), Aleve, Naproxen, Ibuprofen, Motrin, Advil, Goody's, BC's, all herbal medications, fish oil, and all vitamins   Do not wear jewelry, make-up or nail polish.  Do not wear lotions, powders, or perfumes, or deodorant.  Do not shave 48 hours prior to surgery.  Men may shave face and neck.  Do not bring valuables to the hospital.  [facility] is not responsible for any belongings or valuables.  Contacts, dentures or bridgework may not be worn into surgery.  Leave your suitcase in the car.  After surgery it may be brought to your room.  For patients admitted to the hospital, discharge time will be determined by your treatment team.  Patients discharged the day of surgery will not be allowed to drive home.   Name and phone number of your driver:    Special instructions:  [place] - Preparing for Surgery  Before surgery, you can play an important role.  Because skin is not sterile, your skin needs to be as free of germs as possible.  You can reduce the number of germs on you skin by washing with CHG (chlorahexidine gluconate) soap before surgery.  CHG is an antiseptic cleaner which kills germs and bonds with the skin to continue killing germs even after washing.  Please DO NOT use if you have an allergy to CHG or antibacterial soaps.  If your skin becomes reddened/irritated stop using the CHG  and inform your nurse when you arrive at Short Stay.  Do not shave (including legs and underarms) for at least 48 hours prior to the first CHG shower.  You may shave your face.  Please follow these instructions carefully:   1.  Shower with CHG Soap the night before surgery and the                                morning of Surgery.  2.  If you choose to wash your hair, wash your hair first as usual with your       normal shampoo.  3.  After you shampoo, rinse your hair and body thoroughly to remove the                      Shampoo.  4.  Use CHG as you would any other liquid soap.  You can apply chg directly       to the skin and wash gently with scrungie or a clean washcloth.  5.  Apply the CHG Soap to your body ONLY FROM THE NECK DOWN.        Do not use on open wounds or open sores.  Avoid contact with your eyes,       ears, mouth and genitals (private parts).  Wash genitals (private parts)  with your normal soap.  6.  Wash thoroughly, paying special attention to the area where your surgery        will be performed.  7.  Thoroughly rinse your body with warm water from the neck down.  8.  DO NOT shower/wash with your normal soap after using and rinsing off       the CHG Soap.  9.  Pat yourself dry with a clean towel.            10.  Wear clean pajamas.            11.  Place clean sheets on your bed the night of your first shower and do not        sleep with pets.  Day of Surgery  Do not apply any lotions/deoderants the morning of surgery.  Please wear clean clothes to the hospital/surgery center.     Please read over the following fact sheets that you were given. MRSA Information and Surgical Site Infection Prevention

## 2017-12-04 ENCOUNTER — Other Ambulatory Visit: Payer: Self-pay

## 2017-12-04 ENCOUNTER — Encounter (HOSPITAL_COMMUNITY)
Admission: RE | Admit: 2017-12-04 | Discharge: 2017-12-04 | Disposition: A | Discharge status: Home / Self Care | Payer: 59 | Source: Ambulatory Visit | Attending: Specialist | Admitting: Specialist

## 2017-12-04 ENCOUNTER — Encounter (HOSPITAL_COMMUNITY): Payer: Self-pay

## 2017-12-04 ENCOUNTER — Ambulatory Visit (HOSPITAL_COMMUNITY)
Admission: RE | Admit: 2017-12-04 | Discharge: 2017-12-04 | Disposition: A | Discharge status: Home / Self Care | Payer: 59 | Source: Ambulatory Visit | Attending: Orthopedic Surgery | Admitting: Orthopedic Surgery

## 2017-12-04 DIAGNOSIS — Z01818 Encounter for other preprocedural examination: Secondary | ICD-10-CM | POA: Insufficient documentation

## 2017-12-04 DIAGNOSIS — M5126 Other intervertebral disc displacement, lumbar region: Secondary | ICD-10-CM | POA: Diagnosis not present

## 2017-12-04 DIAGNOSIS — Z01812 Encounter for preprocedural laboratory examination: Secondary | ICD-10-CM | POA: Insufficient documentation

## 2017-12-04 DIAGNOSIS — M47816 Spondylosis without myelopathy or radiculopathy, lumbar region: Secondary | ICD-10-CM | POA: Diagnosis not present

## 2017-12-04 HISTORY — DX: Hyperlipidemia, unspecified: E78.5

## 2017-12-04 LAB — BASIC METABOLIC PANEL
ANION GAP: 11 (ref 5–15)
BUN: 14 mg/dL (ref 6–20)
CALCIUM: 9.2 mg/dL (ref 8.9–10.3)
CHLORIDE: 109 mmol/L (ref 101–111)
CO2: 20 mmol/L — AB (ref 22–32)
Creatinine, Ser: 1.12 mg/dL (ref 0.61–1.24)
GFR calc Af Amer: >60 mL/min (ref >60)
GFR calc non Af Amer: >60 mL/min (ref >60)
Glucose, Bld: 131 mg/dL — ABNORMAL HIGH (ref 65–99)
POTASSIUM: 4.2 mmol/L (ref 3.5–5.1)
Sodium: 140 mmol/L (ref 135–145)

## 2017-12-04 LAB — CBC
HEMATOCRIT: 42.8 % (ref 39.0–52.0)
HEMOGLOBIN: 15 g/dL (ref 13.0–17.0)
MCH: 32.8 pg (ref 26.0–34.0)
MCHC: 35 g/dL (ref 30.0–36.0)
MCV: 93.7 fL (ref 78.0–100.0)
Platelets: 208 10*3/uL (ref 150–400)
RBC: 4.57 MIL/uL (ref 4.22–5.81)
RDW: 12.9 % (ref 11.5–15.5)
WBC: 6.5 10*3/uL (ref 4.0–10.5)

## 2017-12-04 LAB — SURGICAL PCR SCREEN
MRSA, PCR: NEGATIVE
Staphylococcus aureus: NEGATIVE

## 2017-12-04 NOTE — Progress Notes (Signed)
PCP  Dr. Guerry Bruinichard Tisovec  No  Cardiologist   Denies Stress test Denies ECHO Denies Cardiac Cath Denies EKG  Last Sleep Study More than 5 years ago  Dr. Earl Galasborne  Sleep Apnea

## 2017-12-08 ENCOUNTER — Ambulatory Visit: Payer: Self-pay | Admitting: Orthopedic Surgery

## 2017-12-08 NOTE — H&P (View-Only) (Signed)
[name] Wood is an 62 y.o. male.   Chief Complaint: back and L leg pain, numbness HPI: Reason for Visit: (normal) visit for: (low back symptoms); The patient complains of a flare up that began about three weeks ago.  Severity: pain level 2/10  Timing: constant  Quality: aching  Aggravating Factors: lying down  Associated Symptoms: numbness/tingling (left lower leg) Notes: The patient completed the second Prednisone three days ago and states that he did get some relief from the medication.  Past Medical History:  Diagnosis Date  . Hyperlipidemia   . Sleep apnea    use CPAP    Past Surgical History:  Procedure Laterality Date  . BACK SURGERY    . COLONOSCOPY    . LUMBAR LAMINECTOMY/DECOMPRESSION MICRODISCECTOMY Left 10/02/2016   Procedure: Microlumbar decompression L4-5 Left, L5-S1 Left;  Surgeon: Jene EveryJeffrey Beane, MD;  Location: [facility];  Service: Orthopedics;  Laterality: Left;  . NO PAST SURGERIES     Medication Pravastatin 40mg  daily  No family history on file. Social History:  reports that he quit smoking about 33 years ago. His smoking use included cigarettes. He has never used smokeless tobacco. He reports that he drinks about 0.5 oz of alcohol per week. He reports that he does not use drugs.  Allergies: No Known Allergies   Review of Systems  Constitutional: Negative.   HENT: Negative.   Eyes: Negative.   Respiratory: Negative.   Cardiovascular: Negative.   Gastrointestinal: Negative.   Genitourinary: Negative.   Musculoskeletal: Positive for back pain.  Skin: Negative.   Neurological: Positive for sensory change and focal weakness.  Psychiatric/Behavioral: Negative.     There were no vitals taken for this visit. Physical Exam  Constitutional: He is oriented to person, place, and time. He appears well-developed. He appears distressed.  HENT:  Head: Normocephalic.  Eyes: Pupils are equal, round, and reactive to light.  Neck: Normal range of motion.   Cardiovascular: Normal rate.  Respiratory: Effort normal.  GI: Soft.  Musculoskeletal:  Patient is a 62 year old male.  Gait and Station: Appearance: ambulating with no assistive devices and antalgic gait.  Constitutional: General Appearance: healthy-appearing and distress (mild).  Psychiatric: Mood and Affect: active and alert.  Cardiovascular System: Edema Right: none; Dorsalis and posterior tibial pulses 2+. Edema Left: none.  Abdomen: Inspection and Palpation: non-distended and no tenderness.  Skin: Inspection and palpation: no rash.  Lumbar Spine: Inspection: normal alignment. Bony Palpation of the Lumbar Spine: tender at lumbosacral junction.. Bony Palpation of the Right Hip: no tenderness of the greater trochanter and tenderness of the SI joint; Pelvis stable. Bony Palpation of the Left Hip: no tenderness of the greater trochanter and tenderness of the SI joint. Soft Tissue Palpation on the Right: No flank pain with percussion. Active Range of Motion: limited flexion and extention.  Motor Strength: L1 Motor Strength on the Right: hip flexion iliopsoas 5/5. L1 Motor Strength on the Left: hip flexion iliopsoas 5/5. L2-L4 Motor Strength on the Right: knee extension quadriceps 5/5. L2-L4 Motor Strength on the Left: knee extension quadriceps 5/5. L5 Motor Strength on the Right: great toe extension extensor hallucis longus 5/5 and ankle dorsiflexion tibialis anterior 3/5. L5 Motor Strength on the Left: ankle dorsiflexion tibialis anterior 5/5 and great toe extension extensor hallucis longus 5/5. S1 Motor Strength on the Right: plantar flexion gastrocnemius 5/5. S1 Motor Strength on the Left: plantar flexion gastrocnemius 5/5.  Neurological System: Knee Reflex Right: normal (2). Knee Reflex Left: diminished (1). Ankle Reflex Right:  normal (2). Ankle Reflex Left: normal (2). Babinski Reflex Right: plantar reflex absent. Babinski Reflex Left: plantar reflex absent. Sensation on the Right:  normal distal extremities. Sensation on the Left: normal distal extremities and decreased sensation of the knee and medial leg (L4). Special Tests on the Right: no clonus of the ankle/knee. Special Tests on the Left: no clonus of the ankle/knee and seated straight leg raising test positive.  Neurological: He is alert and oriented to person, place, and time.     Assessment/Plan The patient reports that over the past week developed some numbness in the anterior aspect of the tibia. He report although some initial increase in his strength it has not improved as over the past for 5 days. He took a second dose pack. He does have a mild straight leg raise at this point  We discussed at this point time pain medicine for nighttime  At this point Neutra agreed to initiate the process of proceeding with lumbar decompression at 3 4. In the interim we will try an epidural at 3 4 just to see whether that can help him as well. His previous surgery was over 6 months of conservative treatment and surgery was inevitable.  Continued activity modification. If there are any significant change in the interim he is to call.  I had an extensive discussion with the patient concerning the pathology relevant anatomy and treatment options. At this point exhausting conservative treatment and in the presence of a neurologic deficit we discussed microlumbar decompression. I discussed the risks and benefits including bleeding, infection, DVT, PE, anesthetic complications, worsening in their symptoms, improvement in their symptoms, C SF leakage, epidural fibrosis, need for future surgeries such as revision discectomy and lumbar fusion. I also indicated that this is an operation to basically decompress the nerve root to allow recovery as opposed to fixing a herniated disc and that the incidence of recurrent chest disc herniation can approach 15%. Also that nerve root recovery is variable and may not recover completely.  I discussed  the operative course including overnight in the hospital. Immediate ambulation. Follow-up in 2 weeks for suture removal. 6 weeks until healing of the herniation followed by 6 weeks of reconditioning and strengthening of the core musculature. Also discussed the need to employ the concepts of disc pressure management and core motion following the surgery to minimize the risk of recurrent disc herniation. We will obtain preoperative clearance if necessary and proceed accordingly.  Also that this is at 3 4 will require central decompression.  Plan microlumbar decompression L3-4  Dorothy Spark., PA-C for Dr. Shelle Iron 12/08/2017, 10:11 AM

## 2017-12-08 NOTE — H&P (Signed)
[name] Wood is an 62 y.o. male.   Chief Complaint: back and L leg pain, numbness HPI: Reason for Visit: (normal) visit for: (low back symptoms); The patient complains of a flare up that began about three weeks ago.  Severity: pain level 2/10  Timing: constant  Quality: aching  Aggravating Factors: lying down  Associated Symptoms: numbness/tingling (left lower leg) Notes: The patient completed the second Prednisone three days ago and states that he did get some relief from the medication.  Past Medical History:  Diagnosis Date  . Hyperlipidemia   . Sleep apnea    use CPAP    Past Surgical History:  Procedure Laterality Date  . BACK SURGERY    . COLONOSCOPY    . LUMBAR LAMINECTOMY/DECOMPRESSION MICRODISCECTOMY Left 10/02/2016   Procedure: Microlumbar decompression L4-5 Left, L5-S1 Left;  Surgeon: Jene EveryJeffrey Beane, MD;  Location: [facility];  Service: Orthopedics;  Laterality: Left;  . NO PAST SURGERIES     Medication Pravastatin 40mg  daily  No family history on file. Social History:  reports that he quit smoking about 33 years ago. His smoking use included cigarettes. He has never used smokeless tobacco. He reports that he drinks about 0.5 oz of alcohol per week. He reports that he does not use drugs.  Allergies: No Known Allergies   Review of Systems  Constitutional: Negative.   HENT: Negative.   Eyes: Negative.   Respiratory: Negative.   Cardiovascular: Negative.   Gastrointestinal: Negative.   Genitourinary: Negative.   Musculoskeletal: Positive for back pain.  Skin: Negative.   Neurological: Positive for sensory change and focal weakness.  Psychiatric/Behavioral: Negative.     There were no vitals taken for this visit. Physical Exam  Constitutional: He is oriented to person, place, and time. He appears well-developed. He appears distressed.  HENT:  Head: Normocephalic.  Eyes: Pupils are equal, round, and reactive to light.  Neck: Normal range of motion.   Cardiovascular: Normal rate.  Respiratory: Effort normal.  GI: Soft.  Musculoskeletal:  Patient is a 62 year old male.  Gait and Station: Appearance: ambulating with no assistive devices and antalgic gait.  Constitutional: General Appearance: healthy-appearing and distress (mild).  Psychiatric: Mood and Affect: active and alert.  Cardiovascular System: Edema Right: none; Dorsalis and posterior tibial pulses 2+. Edema Left: none.  Abdomen: Inspection and Palpation: non-distended and no tenderness.  Skin: Inspection and palpation: no rash.  Lumbar Spine: Inspection: normal alignment. Bony Palpation of the Lumbar Spine: tender at lumbosacral junction.. Bony Palpation of the Right Hip: no tenderness of the greater trochanter and tenderness of the SI joint; Pelvis stable. Bony Palpation of the Left Hip: no tenderness of the greater trochanter and tenderness of the SI joint. Soft Tissue Palpation on the Right: No flank pain with percussion. Active Range of Motion: limited flexion and extention.  Motor Strength: L1 Motor Strength on the Right: hip flexion iliopsoas 5/5. L1 Motor Strength on the Left: hip flexion iliopsoas 5/5. L2-L4 Motor Strength on the Right: knee extension quadriceps 5/5. L2-L4 Motor Strength on the Left: knee extension quadriceps 5/5. L5 Motor Strength on the Right: great toe extension extensor hallucis longus 5/5 and ankle dorsiflexion tibialis anterior 3/5. L5 Motor Strength on the Left: ankle dorsiflexion tibialis anterior 5/5 and great toe extension extensor hallucis longus 5/5. S1 Motor Strength on the Right: plantar flexion gastrocnemius 5/5. S1 Motor Strength on the Left: plantar flexion gastrocnemius 5/5.  Neurological System: Knee Reflex Right: normal (2). Knee Reflex Left: diminished (1). Ankle Reflex Right:  normal (2). Ankle Reflex Left: normal (2). Babinski Reflex Right: plantar reflex absent. Babinski Reflex Left: plantar reflex absent. Sensation on the Right:  normal distal extremities. Sensation on the Left: normal distal extremities and decreased sensation of the knee and medial leg (L4). Special Tests on the Right: no clonus of the ankle/knee. Special Tests on the Left: no clonus of the ankle/knee and seated straight leg raising test positive.  Neurological: He is alert and oriented to person, place, and time.     Assessment/Plan The patient reports that over the past week developed some numbness in the anterior aspect of the tibia. He report although some initial increase in his strength it has not improved as over the past for 5 days. He took a second dose pack. He does have a mild straight leg raise at this point  We discussed at this point time pain medicine for nighttime  At this point Neutra agreed to initiate the process of proceeding with lumbar decompression at 3 4. In the interim we will try an epidural at 3 4 just to see whether that can help him as well. His previous surgery was over 6 months of conservative treatment and surgery was inevitable.  Continued activity modification. If there are any significant change in the interim he is to call.  I had an extensive discussion with the patient concerning the pathology relevant anatomy and treatment options. At this point exhausting conservative treatment and in the presence of a neurologic deficit we discussed microlumbar decompression. I discussed the risks and benefits including bleeding, infection, DVT, PE, anesthetic complications, worsening in their symptoms, improvement in their symptoms, C SF leakage, epidural fibrosis, need for future surgeries such as revision discectomy and lumbar fusion. I also indicated that this is an operation to basically decompress the nerve root to allow recovery as opposed to fixing a herniated disc and that the incidence of recurrent chest disc herniation can approach 15%. Also that nerve root recovery is variable and may not recover completely.  I discussed  the operative course including overnight in the hospital. Immediate ambulation. Follow-up in 2 weeks for suture removal. 6 weeks until healing of the herniation followed by 6 weeks of reconditioning and strengthening of the core musculature. Also discussed the need to employ the concepts of disc pressure management and core motion following the surgery to minimize the risk of recurrent disc herniation. We will obtain preoperative clearance if necessary and proceed accordingly.  Also that this is at 3 4 will require central decompression.  Plan microlumbar decompression L3-4  Dorothy Spark., PA-C for Dr. Shelle Iron 12/08/2017, 10:11 AM

## 2017-12-09 DIAGNOSIS — M5416 Radiculopathy, lumbar region: Secondary | ICD-10-CM | POA: Diagnosis not present

## 2017-12-10 NOTE — Anesthesia Preprocedure Evaluation (Addendum)
Anesthesia Evaluation  Patient identified by MRN, date of birth, ID band Patient awake    Reviewed: Allergy & Precautions, NPO status , Patient's Chart, lab work & pertinent test results  History of Anesthesia Complications Negative for: history of anesthetic complications  Airway Mallampati: II  TM Distance: >3 FB Neck ROM: Full    Dental no notable dental hx. (+) Dental Advisory Given   Pulmonary neg pulmonary ROS, sleep apnea and Continuous Positive Airway Pressure Ventilation , former smoker,    Pulmonary exam normal        Cardiovascular negative cardio ROS Normal cardiovascular exam     Neuro/Psych    GI/Hepatic negative GI ROS, Neg liver ROS,   Endo/Other  negative endocrine ROS  Renal/GU negative Renal ROS     Musculoskeletal negative musculoskeletal ROS (+)   Abdominal   Peds  Hematology negative hematology ROS (+)   Anesthesia Other Findings Day of surgery medications reviewed with the patient.  Reproductive/Obstetrics                            Anesthesia Physical Anesthesia Plan  ASA: II  Anesthesia Plan: General   Post-op Pain Management:    Induction: Intravenous  PONV Risk Score and Plan: 3 and Ondansetron, Dexamethasone and Scopolamine patch - Pre-op  Airway Management Planned: Oral ETT  Additional Equipment:   Intra-op Plan:   Post-operative Plan: Extubation in OR  Informed Consent: I have reviewed the patients History and Physical, chart, labs and discussed the procedure including the risks, benefits and alternatives for the proposed anesthesia with the patient or authorized representative who has indicated his/her understanding and acceptance.   Dental advisory given  Plan Discussed with: CRNA, Anesthesiologist and Surgeon  Anesthesia Plan Comments:        Anesthesia Quick Evaluation

## 2017-12-11 ENCOUNTER — Encounter (HOSPITAL_COMMUNITY): Payer: Self-pay | Admitting: *Deleted

## 2017-12-11 ENCOUNTER — Ambulatory Visit (HOSPITAL_COMMUNITY): Payer: 59 | Admitting: Certified Registered Nurse Anesthetist

## 2017-12-11 ENCOUNTER — Ambulatory Visit (HOSPITAL_COMMUNITY)
Admission: RE | Admit: 2017-12-11 | Discharge: 2017-12-11 | Disposition: A | Discharge status: Home / Self Care | Payer: 59 | Source: Ambulatory Visit | Attending: Specialist | Admitting: Specialist

## 2017-12-11 ENCOUNTER — Ambulatory Visit (HOSPITAL_COMMUNITY)
Admission: RE | Disposition: A | Discharge status: Home / Self Care | Payer: Self-pay | Source: Ambulatory Visit | Attending: Specialist

## 2017-12-11 ENCOUNTER — Ambulatory Visit (HOSPITAL_COMMUNITY): Payer: 59

## 2017-12-11 DIAGNOSIS — M5126 Other intervertebral disc displacement, lumbar region: Secondary | ICD-10-CM | POA: Diagnosis not present

## 2017-12-11 DIAGNOSIS — Z9989 Dependence on other enabling machines and devices: Secondary | ICD-10-CM | POA: Diagnosis not present

## 2017-12-11 DIAGNOSIS — G4730 Sleep apnea, unspecified: Secondary | ICD-10-CM | POA: Diagnosis not present

## 2017-12-11 DIAGNOSIS — M48061 Spinal stenosis, lumbar region without neurogenic claudication: Secondary | ICD-10-CM | POA: Diagnosis not present

## 2017-12-11 DIAGNOSIS — M48062 Spinal stenosis, lumbar region with neurogenic claudication: Secondary | ICD-10-CM | POA: Diagnosis not present

## 2017-12-11 DIAGNOSIS — Z981 Arthrodesis status: Secondary | ICD-10-CM | POA: Diagnosis not present

## 2017-12-11 DIAGNOSIS — Z419 Encounter for procedure for purposes other than remedying health state, unspecified: Secondary | ICD-10-CM

## 2017-12-11 DIAGNOSIS — Z87891 Personal history of nicotine dependence: Secondary | ICD-10-CM | POA: Diagnosis not present

## 2017-12-11 DIAGNOSIS — E785 Hyperlipidemia, unspecified: Secondary | ICD-10-CM | POA: Insufficient documentation

## 2017-12-11 HISTORY — PX: LUMBAR LAMINECTOMY/DECOMPRESSION MICRODISCECTOMY: SHX5026

## 2017-12-11 SURGERY — LUMBAR LAMINECTOMY/DECOMPRESSION MICRODISCECTOMY 1 LEVEL
Anesthesia: General

## 2017-12-11 MED ORDER — CEFAZOLIN SODIUM-DEXTROSE 2-4 GM/100ML-% IV SOLN
2.0000 g | INTRAVENOUS | Status: AC
  Administered 2017-12-11: 2 g via INTRAVENOUS

## 2017-12-11 MED ORDER — ACETAMINOPHEN 10 MG/ML IV SOLN
1000.0000 mg | INTRAVENOUS | Status: AC
  Administered 2017-12-11: 1000 mg via INTRAVENOUS
  Filled 2017-12-11: qty 100

## 2017-12-11 MED ORDER — MIDAZOLAM HCL 2 MG/2ML IJ SOLN
INTRAMUSCULAR | Status: AC
  Filled 2017-12-11: qty 2

## 2017-12-11 MED ORDER — PHENYLEPHRINE HCL 10 MG/ML IJ SOLN
INTRAVENOUS | Status: DC | PRN
  Administered 2017-12-11: 15 ug/min via INTRAVENOUS

## 2017-12-11 MED ORDER — POLYETHYLENE GLYCOL 3350 17 G PO PACK: 17.0000 g | PACK | Freq: Every day | ORAL | Status: DC | PRN

## 2017-12-11 MED ORDER — OXYCODONE HCL 5 MG PO TABS: 5.0000 mg | ORAL_TABLET | ORAL | Status: DC | PRN

## 2017-12-11 MED ORDER — METHOCARBAMOL 1000 MG/10ML IJ SOLN
500.0000 mg | Freq: Four times a day (QID) | INTRAVENOUS | Status: DC | PRN
  Filled 2017-12-11: qty 5

## 2017-12-11 MED ORDER — HYDROMORPHONE HCL 1 MG/ML IJ SOLN: 1.0000 mg | INTRAMUSCULAR | Status: DC | PRN

## 2017-12-11 MED ORDER — ONDANSETRON HCL 4 MG/2ML IJ SOLN
INTRAMUSCULAR | Status: AC
  Filled 2017-12-11: qty 4

## 2017-12-11 MED ORDER — OXYCODONE HCL 5 MG PO TABS
10.0000 mg | ORAL_TABLET | ORAL | Status: DC | PRN
  Administered 2017-12-11: 10 mg via ORAL
  Filled 2017-12-11: qty 2

## 2017-12-11 MED ORDER — ROCURONIUM BROMIDE 100 MG/10ML IV SOLN
INTRAVENOUS | Status: DC | PRN
  Administered 2017-12-11: 50 mg via INTRAVENOUS
  Administered 2017-12-11: 10 mg via INTRAVENOUS

## 2017-12-11 MED ORDER — DOCUSATE SODIUM 100 MG PO CAPS: 100.0000 mg | ORAL_CAPSULE | Freq: Two times a day (BID) | ORAL | Status: DC

## 2017-12-11 MED ORDER — LIDOCAINE 2% (20 MG/ML) 5 ML SYRINGE
INTRAMUSCULAR | Status: AC
  Filled 2017-12-11: qty 10

## 2017-12-11 MED ORDER — MENTHOL 3 MG MT LOZG: 1.0000 | LOZENGE | OROMUCOSAL | Status: DC | PRN

## 2017-12-11 MED ORDER — CEFAZOLIN SODIUM-DEXTROSE 2-4 GM/100ML-% IV SOLN
2.0000 g | Freq: Three times a day (TID) | INTRAVENOUS | Status: DC
  Administered 2017-12-11: 2 g via INTRAVENOUS
  Filled 2017-12-11: qty 100

## 2017-12-11 MED ORDER — FENTANYL CITRATE (PF) 250 MCG/5ML IJ SOLN
INTRAMUSCULAR | Status: DC | PRN
  Administered 2017-12-11: 50 ug via INTRAVENOUS
  Administered 2017-12-11: 150 ug via INTRAVENOUS
  Administered 2017-12-11: 50 ug via INTRAVENOUS

## 2017-12-11 MED ORDER — SUGAMMADEX SODIUM 200 MG/2ML IV SOLN
INTRAVENOUS | Status: DC | PRN
  Administered 2017-12-11: 222.2 mg via INTRAVENOUS

## 2017-12-11 MED ORDER — SUGAMMADEX SODIUM 200 MG/2ML IV SOLN
INTRAVENOUS | Status: AC
  Filled 2017-12-11: qty 4

## 2017-12-11 MED ORDER — 0.9 % SODIUM CHLORIDE (POUR BTL) OPTIME
TOPICAL | Status: DC | PRN
  Administered 2017-12-11: 1000 mL

## 2017-12-11 MED ORDER — PHENYLEPHRINE 40 MCG/ML (10ML) SYRINGE FOR IV PUSH (FOR BLOOD PRESSURE SUPPORT)
PREFILLED_SYRINGE | INTRAVENOUS | Status: AC
  Filled 2017-12-11: qty 10

## 2017-12-11 MED ORDER — ROCURONIUM BROMIDE 10 MG/ML (PF) SYRINGE
PREFILLED_SYRINGE | INTRAVENOUS | Status: AC
  Filled 2017-12-11: qty 10

## 2017-12-11 MED ORDER — FENTANYL CITRATE (PF) 100 MCG/2ML IJ SOLN: 25.0000 ug | INTRAMUSCULAR | Status: DC | PRN

## 2017-12-11 MED ORDER — ONDANSETRON HCL 4 MG/2ML IJ SOLN
INTRAMUSCULAR | Status: DC | PRN
  Administered 2017-12-11: 4 mg via INTRAVENOUS

## 2017-12-11 MED ORDER — PROPOFOL 10 MG/ML IV BOLUS
INTRAVENOUS | Status: DC | PRN
  Administered 2017-12-11: 200 mg via INTRAVENOUS

## 2017-12-11 MED ORDER — KCL IN DEXTROSE-NACL 20-5-0.45 MEQ/L-%-% IV SOLN: INTRAVENOUS | Status: DC

## 2017-12-11 MED ORDER — DEXAMETHASONE SODIUM PHOSPHATE 4 MG/ML IJ SOLN
INTRAMUSCULAR | Status: DC | PRN
  Administered 2017-12-11: 10 mg via INTRAVENOUS

## 2017-12-11 MED ORDER — DOCUSATE SODIUM 100 MG PO CAPS: 100.0000 mg | ORAL_CAPSULE | Freq: Every day | ORAL | 2 refills | Status: AC | PRN

## 2017-12-11 MED ORDER — PHENOL 1.4 % MT LIQD: 1.0000 | OROMUCOSAL | Status: DC | PRN

## 2017-12-11 MED ORDER — LACTATED RINGERS IV SOLN
INTRAVENOUS | Status: DC
  Administered 2017-12-11 (×2): via INTRAVENOUS

## 2017-12-11 MED ORDER — PROPOFOL 10 MG/ML IV BOLUS
INTRAVENOUS | Status: AC
  Filled 2017-12-11: qty 20

## 2017-12-11 MED ORDER — MIDAZOLAM HCL 5 MG/5ML IJ SOLN
INTRAMUSCULAR | Status: DC | PRN
  Administered 2017-12-11: 2 mg via INTRAVENOUS

## 2017-12-11 MED ORDER — BISACODYL 5 MG PO TBEC: 5.0000 mg | DELAYED_RELEASE_TABLET | Freq: Every day | ORAL | Status: DC | PRN

## 2017-12-11 MED ORDER — POLYETHYLENE GLYCOL 3350 17 G PO PACK: 17.0000 g | PACK | Freq: Every day | ORAL | 0 refills | Status: DC

## 2017-12-11 MED ORDER — BUPIVACAINE-EPINEPHRINE 0.5% -1:200000 IJ SOLN
INTRAMUSCULAR | Status: DC | PRN
  Administered 2017-12-11: 12 mL

## 2017-12-11 MED ORDER — DEXAMETHASONE SODIUM PHOSPHATE 10 MG/ML IJ SOLN
INTRAMUSCULAR | Status: AC
  Filled 2017-12-11: qty 2

## 2017-12-11 MED ORDER — MAGNESIUM CITRATE PO SOLN: 1.0000 | Freq: Once | ORAL | Status: DC | PRN

## 2017-12-11 MED ORDER — ALUM & MAG HYDROXIDE-SIMETH 200-200-20 MG/5ML PO SUSP: 30.0000 mL | Freq: Four times a day (QID) | ORAL | Status: DC | PRN

## 2017-12-11 MED ORDER — ONDANSETRON HCL 4 MG PO TABS: 4.0000 mg | ORAL_TABLET | Freq: Four times a day (QID) | ORAL | Status: DC | PRN

## 2017-12-11 MED ORDER — CEFAZOLIN SODIUM-DEXTROSE 2-4 GM/100ML-% IV SOLN
INTRAVENOUS | Status: AC
  Filled 2017-12-11: qty 100

## 2017-12-11 MED ORDER — PHENYLEPHRINE HCL 10 MG/ML IJ SOLN
INTRAMUSCULAR | Status: DC | PRN
  Administered 2017-12-11 (×2): 40 ug via INTRAVENOUS

## 2017-12-11 MED ORDER — ACETAMINOPHEN 325 MG PO TABS: 650.0000 mg | ORAL_TABLET | ORAL | Status: DC | PRN

## 2017-12-11 MED ORDER — OXYCODONE HCL 5 MG PO TABS: 5.0000 mg | ORAL_TABLET | ORAL | 0 refills | Status: AC | PRN

## 2017-12-11 MED ORDER — LIDOCAINE HCL (CARDIAC) PF 100 MG/5ML IV SOSY
PREFILLED_SYRINGE | INTRAVENOUS | Status: DC | PRN
  Administered 2017-12-11: 100 mg via INTRAVENOUS

## 2017-12-11 MED ORDER — THROMBIN (RECOMBINANT) 20000 UNITS EX SOLR
CUTANEOUS | Status: DC | PRN
  Administered 2017-12-11: 11:00:00 via TOPICAL

## 2017-12-11 MED ORDER — BUPIVACAINE-EPINEPHRINE (PF) 0.5% -1:200000 IJ SOLN
INTRAMUSCULAR | Status: AC
  Filled 2017-12-11: qty 30

## 2017-12-11 MED ORDER — SCOPOLAMINE 1 MG/3DAYS TD PT72
1.0000 | MEDICATED_PATCH | TRANSDERMAL | Status: DC
  Administered 2017-12-11: 1.5 mg via TRANSDERMAL
  Filled 2017-12-11: qty 1

## 2017-12-11 MED ORDER — METHOCARBAMOL 500 MG PO TABS: 500.0000 mg | ORAL_TABLET | Freq: Four times a day (QID) | ORAL | 1 refills | Status: DC | PRN

## 2017-12-11 MED ORDER — RISAQUAD PO CAPS
1.0000 | ORAL_CAPSULE | Freq: Every day | ORAL | Status: DC
  Filled 2017-12-11: qty 1

## 2017-12-11 MED ORDER — THROMBIN 20000 UNITS EX SOLR
CUTANEOUS | Status: AC
  Filled 2017-12-11: qty 20000

## 2017-12-11 MED ORDER — SUCCINYLCHOLINE CHLORIDE 20 MG/ML IJ SOLN
INTRAMUSCULAR | Status: DC | PRN
  Administered 2017-12-11: 120 mg via INTRAVENOUS

## 2017-12-11 MED ORDER — ONDANSETRON HCL 4 MG/2ML IJ SOLN: 4.0000 mg | Freq: Four times a day (QID) | INTRAMUSCULAR | Status: DC | PRN

## 2017-12-11 MED ORDER — ACETAMINOPHEN 650 MG RE SUPP: 650.0000 mg | RECTAL | Status: DC | PRN

## 2017-12-11 MED ORDER — METHOCARBAMOL 500 MG PO TABS
500.0000 mg | ORAL_TABLET | Freq: Four times a day (QID) | ORAL | Status: DC | PRN
  Administered 2017-12-11: 500 mg via ORAL
  Filled 2017-12-11: qty 1

## 2017-12-11 MED ORDER — PROMETHAZINE HCL 25 MG/ML IJ SOLN: 6.2500 mg | INTRAMUSCULAR | Status: DC | PRN

## 2017-12-11 MED ORDER — FENTANYL CITRATE (PF) 250 MCG/5ML IJ SOLN
INTRAMUSCULAR | Status: AC
  Filled 2017-12-11: qty 5

## 2017-12-11 MED ORDER — SODIUM CHLORIDE 0.9 % IV SOLN
INTRAVENOUS | Status: DC | PRN
  Administered 2017-12-11: 11:00:00

## 2017-12-11 MED ORDER — CEPHALEXIN 500 MG PO CAPS: 500.0000 mg | ORAL_CAPSULE | Freq: Four times a day (QID) | ORAL | 0 refills | Status: AC

## 2017-12-11 SURGICAL SUPPLY — 48 items
BAG DECANTER FOR FLEXI CONT (MISCELLANEOUS) ×2 IMPLANT
CLEANER TIP ELECTROSURG 2X2 (MISCELLANEOUS) ×2 IMPLANT
CLOTH 2% CHLOROHEXIDINE 3PK (PERSONAL CARE ITEMS) ×2 IMPLANT
CONT SPEC 4OZ CLIKSEAL STRL BL (MISCELLANEOUS) ×2 IMPLANT
DRAPE LAPAROTOMY 100X72X124 (DRAPES) ×2 IMPLANT
DRAPE MICROSCOPE LEICA (MISCELLANEOUS) ×2 IMPLANT
DRAPE SHEET LG 3/4 BI-LAMINATE (DRAPES) ×2 IMPLANT
DRAPE SURG 17X11 SM STRL (DRAPES) ×2 IMPLANT
DRAPE UTILITY XL STRL (DRAPES) ×2 IMPLANT
DRSG AQUACEL AG ADV 3.5X 4 (GAUZE/BANDAGES/DRESSINGS) ×2 IMPLANT
DRSG AQUACEL AG ADV 3.5X 6 (GAUZE/BANDAGES/DRESSINGS) IMPLANT
DRSG TELFA 3X8 NADH (GAUZE/BANDAGES/DRESSINGS) IMPLANT
DURAPREP 26ML APPLICATOR (WOUND CARE) ×2 IMPLANT
DURASEAL SPINE SEALANT 3ML (MISCELLANEOUS) IMPLANT
ELECT BLADE 4.0 EZ CLEAN MEGAD (MISCELLANEOUS) ×2
ELECTRODE BLDE 4.0 EZ CLN MEGD (MISCELLANEOUS) ×1 IMPLANT
GLOVE BIOGEL PI IND STRL 7.0 (GLOVE) ×1 IMPLANT
GLOVE BIOGEL PI INDICATOR 7.0 (GLOVE) ×1
GLOVE SURG SS PI 7.5 STRL IVOR (GLOVE) ×2 IMPLANT
GLOVE SURG SS PI 8.0 STRL IVOR (GLOVE) ×4 IMPLANT
GOWN STRL REUS W/ TWL LRG LVL3 (GOWN DISPOSABLE) ×1 IMPLANT
GOWN STRL REUS W/ TWL XL LVL3 (GOWN DISPOSABLE) ×1 IMPLANT
GOWN STRL REUS W/TWL LRG LVL3 (GOWN DISPOSABLE) ×1
GOWN STRL REUS W/TWL XL LVL3 (GOWN DISPOSABLE) ×1
IV CATH 14GX2 1/4 (CATHETERS) ×2 IMPLANT
KIT BASIN OR (CUSTOM PROCEDURE TRAY) ×2 IMPLANT
NEEDLE 22X1 1/2 (OR ONLY) (NEEDLE) ×2 IMPLANT
NEEDLE SPNL 18GX3.5 QUINCKE PK (NEEDLE) ×4 IMPLANT
PACK LAMINECTOMY NEURO (CUSTOM PROCEDURE TRAY) ×2 IMPLANT
PATTIES SURGICAL .75X.75 (GAUZE/BANDAGES/DRESSINGS) IMPLANT
RUBBERBAND STERILE (MISCELLANEOUS) ×4 IMPLANT
SPONGE LAP 4X18 X RAY DECT (DISPOSABLE) IMPLANT
SPONGE SURGIFOAM ABS GEL 100 (HEMOSTASIS) ×2 IMPLANT
STAPLER VISISTAT (STAPLE) ×2 IMPLANT
STRIP CLOSURE SKIN 1/2X4 (GAUZE/BANDAGES/DRESSINGS) ×2 IMPLANT
SUT NURALON 4 0 TR CR/8 (SUTURE) IMPLANT
SUT PROLENE 3 0 PS 2 (SUTURE) ×2 IMPLANT
SUT VIC AB 1 CT1 27 (SUTURE) ×2
SUT VIC AB 1 CT1 27XBRD ANBCTR (SUTURE) ×2 IMPLANT
SUT VIC AB 1 CT1 27XBRD ANTBC (SUTURE) IMPLANT
SUT VIC AB 1-0 CT2 27 (SUTURE) IMPLANT
SUT VIC AB 2-0 CT1 27 (SUTURE) ×2
SUT VIC AB 2-0 CT1 TAPERPNT 27 (SUTURE) ×2 IMPLANT
SUT VIC AB 2-0 CT2 27 (SUTURE) IMPLANT
SYR 3ML LL SCALE MARK (SYRINGE) ×2 IMPLANT
TOWEL GREEN STERILE (TOWEL DISPOSABLE) ×2 IMPLANT
TOWEL GREEN STERILE FF (TOWEL DISPOSABLE) ×2 IMPLANT
YANKAUER SUCT BULB TIP NO VENT (SUCTIONS) ×2 IMPLANT

## 2017-12-11 NOTE — Interval H&P Note (Signed)
History and Physical Interval Note:  12/11/2017 9:35 AM  [name]  has presented today for surgery, with the diagnosis of HNP Stenosis L3-4  The various methods of treatment have been discussed with the patient and family. After consideration of risks, benefits and other options for treatment, the patient has consented to  Procedure(s) with comments: Microlumbar decompression L3-4 (N/A) - 2 hrs as a surgical intervention .  The patient's history has been reviewed, patient examined, no change in status, stable for surgery.  I have reviewed the patient's chart and labs.  Questions were answered to the patient's satisfaction.     Allix Blomquist C

## 2017-12-11 NOTE — Transfer of Care (Signed)
Immediate Anesthesia Transfer of Care Note  Patient: [name] Wood  Procedure(s) Performed: Microlumbar decompression Lumbar Three- Four (N/A )  Patient Location: PACU  Anesthesia Type:General  Level of Consciousness: awake, alert , oriented, patient cooperative and responds to stimulation  Airway & Oxygen Therapy: Patient Spontanous Breathing and Patient connected to face mask oxygen  Post-op Assessment: Report given to RN, Post -op Vital signs reviewed and stable and Patient moving all extremities X 4  Post vital signs: Reviewed and stable  Last Vitals:  Vitals Value Taken Time  BP 149/82 12/11/2017 12:08 PM  Temp    Pulse 83 12/11/2017 12:08 PM  Resp 23 12/11/2017 12:08 PM  SpO2 94 % 12/11/2017 12:08 PM  Vitals shown include unvalidated device data.  Last Pain:  Vitals:   12/11/17 0815  TempSrc: Oral  PainSc:       Patients Stated Pain Goal: 1 (12/11/17 0754)  Complications: No apparent anesthesia complications

## 2017-12-11 NOTE — Brief Op Note (Signed)
4/18/20192  11:52 AM  PATIENT:  [name] Wood  62 y.o. male  PRE-OPERATIVE DIAGNOSIS:  Herniation nucleus pulposus Stenosis Lumbar Three-Four  POST-OPERATIVE DIAGNOSIS:  Herniation nucleus pulposus Stenosis Lumbar Three-Four  PROCEDURE:  Procedure(s) with comments: Microlumbar decompression Lumbar Three- Four (N/A) - Microlumbar decompression Lumbar Three- Four  SURGEON:  Surgeon(s) and Role:    Jene Every* Toshika Parrow, MD - Primary  PHYSICIAN ASSISTANT:   ASSISTANTS: Bissell   ANESTHESIA:   general  EBL:  50 mL   BLOOD ADMINISTERED:none  DRAINS: none   LOCAL MEDICATIONS USED:  MARCAINE     SPECIMEN:  No Specimen  DISPOSITION OF SPECIMEN:  N/A  COUNTS:  YES  TOURNIQUET:  * No tourniquets in log *  DICTATION: .Other Dictation: Dictation Number [id number]  PLAN OF CARE: Admit for overnight observation  PATIENT DISPOSITION:  PACU - hemodynamically stable.   Delay start of Pharmacological VTE agent (>24hrs) due to surgical blood loss or risk of bleeding: yes

## 2017-12-11 NOTE — Evaluation (Signed)
Physical Therapy Evaluation Patient Details Name: [name] Wood MRN: [id number] DOB: 04-29-1956 Today's Date: 12/11/2017   History of Present Illness  Pt is a 62 y.o. male now s/p L3-4 Microlumbar decompression 12/11/17. PMH includes L4-5 decompression (10/02/2016), sleep apnea.    Clinical Impression  Patient evaluated by Physical Therapy with no further acute PT needs identified. PTA, pt indep with mobility and has 24/7 support from wife. Today, pt indep with ambulation and no DME. Increased time performing stair training with HHA assist from wife. Good awareness of and ability to maintain back precautions. All education has been completed and the patient has no further questions. PT is signing off. Thank you for this referral.    Follow Up Recommendations No PT follow up    Equipment Recommendations  None recommended by PT    Recommendations for Other Services       Precautions / Restrictions Precautions Precautions: Back Precaution Booklet Issued: Yes (comment) Precaution Comments: Pt able to recall precautions from lumbar surgery last year Restrictions Weight Bearing Restrictions: No      Mobility  Bed Mobility               General bed mobility comments: Received sitting EOB. Reports indep with log roll technique  Transfers Overall transfer level: Independent Equipment used: None                Ambulation/Gait Ambulation/Gait assistance: Independent Ambulation Distance (Feet): 400 Feet Assistive device: None Gait Pattern/deviations: Step-through pattern;Decreased stride length        Stairs Stairs: Yes Stairs assistance: Supervision Stair Management: No rails;One rail Right Number of Stairs: 20 General stair comments: Practiced descending steps with HHA from wife; pt able to do so with good technique and supervision for safety. Ascended steps with and without rail support  Wheelchair Mobility    Modified Rankin (Stroke Patients Only)        Balance Overall balance assessment: No apparent balance deficits (not formally assessed)                                           Pertinent Vitals/Pain Pain Assessment: Faces Faces Pain Scale: Hurts a little bit Pain Location: Lumbar incision Pain Descriptors / Indicators: Sore Pain Intervention(s): Monitored during session    Home Living Family/patient expects to be discharged to:: Private residence Living Arrangements: Spouse/significant other Available Help at Discharge: Family;Available 24 hours/day Type of Home: House Home Access: Stairs to enter Entrance Stairs-Rails: None Entrance Stairs-Number of Steps: 2 Home Layout: Two level Home Equipment: Walker - 2 wheels Additional Comments: 2 steps down in parking garage with no rail    Prior Function Level of Independence: Independent               Hand Dominance        Extremity/Trunk Assessment   Upper Extremity Assessment Upper Extremity Assessment: Overall WFL for tasks assessed    Lower Extremity Assessment Lower Extremity Assessment: Overall WFL for tasks assessed    Cervical / Trunk Assessment Cervical / Trunk Assessment: Normal  Communication   Communication: No difficulties  Cognition Arousal/Alertness: Awake/alert Behavior During Therapy: WFL for tasks assessed/performed Overall Cognitive Status: Within Functional Limits for tasks assessed  General Comments General comments (skin integrity, edema, etc.): Wife present throughout session    Exercises     Assessment/Plan    PT Assessment Patent does not need any further PT services  PT Problem List         PT Treatment Interventions      PT Goals (Current goals can be found in the Care Plan section)  Acute Rehab PT Goals PT Goal Formulation: All assessment and education complete, DC therapy    Frequency     Barriers to discharge        Co-evaluation                AM-PAC PT "6 Clicks" Daily Activity  Outcome Measure Difficulty turning over in bed (including adjusting bedclothes, sheets and blankets)?: None Difficulty moving from lying on back to sitting on the side of the bed? : None Difficulty sitting down on and standing up from a chair with arms (e.g., wheelchair, bedside commode, etc,.)?: None Help needed moving to and from a bed to chair (including a wheelchair)?: None Help needed walking in hospital room?: None Help needed climbing 3-5 steps with a railing? : A Little 6 Click Score: 23    End of Session   Activity Tolerance: Patient tolerated treatment well Patient left: in bed;with call bell/phone within reach Nurse Communication: Mobility status PT Visit Diagnosis: Other abnormalities of gait and mobility (R26.89)    Time: 8119-1478 PT Time Calculation (min) (ACUTE ONLY): 16 min   Charges:   PT Evaluation $PT Eval Low Complexity: 1 Low     PT G Codes:       Ina Homes, PT, DPT Acute Rehab Services  Pager: [phone]  Malachy Chamber 12/11/2017, 3:34 PM

## 2017-12-11 NOTE — Anesthesia Procedure Notes (Addendum)
Procedure Name: Intubation Date/Time: 12/11/2017 10:16 AM Performed by: Glynda Jaeger, CRNA Pre-anesthesia Checklist: Patient identified, Patient being monitored, Timeout performed, Emergency Drugs available and Suction available Patient Re-evaluated:Patient Re-evaluated prior to induction Oxygen Delivery Method: Circle System Utilized Preoxygenation: Pre-oxygenation with 100% oxygen Induction Type: IV induction Laryngoscope Size: Mac and 4 Grade View: Grade II Tube type: Oral Tube size: 7.5 mm Number of attempts: 1 Airway Equipment and Method: Stylet Placement Confirmation: ETT inserted through vocal cords under direct vision,  positive ETCO2 and breath sounds checked- equal and bilateral Secured at: 21 cm Tube secured with: Tape Dental Injury: Teeth and Oropharynx as per pre-operative assessment

## 2017-12-11 NOTE — Progress Notes (Signed)
Discharge instructions/education/AVS/Rx given to patient with family at bedside and they verbalized understanding. MAE well. Emptying bladder well. No swallowing complaints. No drainage, no redness and no swelling noted. Patient discharged via wheelchair.

## 2017-12-11 NOTE — Anesthesia Postprocedure Evaluation (Signed)
Anesthesia Post Note  Patient: [name] Wood  Procedure(s) Performed: Microlumbar decompression Lumbar Three- Four (N/A )     Patient location during evaluation: PACU Anesthesia Type: General Level of consciousness: sedated Pain management: pain level controlled Vital Signs Assessment: post-procedure vital signs reviewed and stable Respiratory status: spontaneous breathing and respiratory function stable Cardiovascular status: stable Postop Assessment: no apparent nausea or vomiting Anesthetic complications: no    Last Vitals:  Vitals:   12/11/17 1253 12/11/17 1301  BP: 136/78   Pulse: 65   Resp: 16   Temp:  36.6 C  SpO2: 94%     Last Pain:  Vitals:   12/11/17 1301  TempSrc:   PainSc: 0-No pain                 Witten Certain DANIEL

## 2017-12-11 NOTE — Discharge Instructions (Signed)

## 2017-12-12 ENCOUNTER — Encounter (HOSPITAL_COMMUNITY): Payer: Self-pay | Admitting: Specialist

## 2017-12-12 MED FILL — Thrombin For Soln 20000 Unit: CUTANEOUS | Qty: 1 | Status: AC

## 2017-12-12 NOTE — Op Note (Signed)
NAMRoyetta Wood:  Wood, [name] Wood              ACCOUNT NO.:  [id number]  MEDICAL RECORD NO.:  [id number]  LOCATION:                                 FACILITY:  PHYSICIAN:  Jene EveryJeffrey Andreika Vandagriff, M.D.         DATE OF BIRTH:  DATE OF PROCEDURE:  12/11/2017 DATE OF DISCHARGE:                              OPERATIVE REPORT   PREOPERATIVE DIAGNOSIS:  Spinal stenosis, herniated nucleus pulposus at L3-4.  POSTOPERATIVE DIAGNOSIS:  Spinal stenosis, herniated nucleus pulposus at L3-4.  PROCEDURE PERFORMED: 1. Microlumbar decompression centrally at L3-4 with bilateral     hemilaminotomies and lateral recess decompression. 2. Foraminotomy, L3-L4, left. 3. Microdiskectomy, L3-4, left.  ANESTHESIA:  General.  ASSISTANT:  Andrez GrimeJaclyn Bissell, PA.  HISTORY:  A 62 year old male with acute disk herniation at L4 compressing the L4 root footdrop on the left.  MRI indicating disk herniation, migrating caudad at L3-4.  He was indicated for microlumbar decompression at L3-4 essentially with very small interlaminar window. We discussed the risks and benefits including bleeding, infection, damage to the neurovascular structures, no change in symptoms, worsening symptoms, DVT, PE, anesthetic complications, etc.  TECHNIQUE:  With the patient in supine position after induction of adequate general anesthesia, 2 g of Kefzol, placed prone on the Wilson frame.  All bony prominences were well padded.  Lumbar region was prepped and draped in usual sterile fashion.  Two 18-gauge spinal needles were utilized to localize L3-4 interspace confirmed with x-ray. Incision was made from spinous process of L3-4 after infiltration with 0.25% Marcaine with epinephrine.  Subcutaneous tissue was dissected. Electrocautery was utilized to achieve hemostasis.  Dorsolumbar fascia was divided in line with skin incision.  Paraspinous muscle elevated from lamina of L3-4 bilaterally.  McCullough retractor was placed. Operating microscope was draped  and brought on the surgical field.  Near absent interlaminar window.  We then removed the interspinous ligament. Partial spinous process removal of 3 and 4.  I performed hemilaminotomies of the caudad edge of 3 bilaterally, left greater than right, preserving the pars, detaching the ligamentum flavum.  A straight curette was utilized to detach the ligamentum flavum from the cephalad edge of 4.  Next, with a [facility] protecting the neural elements, removed the interspinous, the ligamentum flavum from the interspace, then decompressed the lateral recess on the right side.  We then decompressed and removed ligamentum flavum from the left protecting the neural elements at all times.  The 4 root was found to be severely compressed into the lateral recess from the extruded fragment migrating caudad.  I performed a foraminotomy of L4 to mobilize the nerve root.  The fragment was noted to have migrated caudad from the disk space.  Without tension applied to the L4 root, we gently mobilized the disk herniation from beneath the 4 root with a nerve hook and protecting it with a Penfield. Three large fragments were removed as one extending out into the foramen was as well.  This decompressed the 4 root.  We retracted back into the disk space, made a small aperture in the disk space and removed a couple additional fragments within the disk space and irrigated with catheter lavage.  No further  fragments were noted.  Confirmatory radiograph obtained.  We had cauterized epidural venous plexus.  Used bone wax on the cancellous surfaces.  Confirmatory radiograph obtained at the disk space.  Large disk herniation of the normal material did not send to Pathology.  With restoration of thecal sac, neural probe passed freely at the foramen of 4 and 3 and past pedicle of 3.  Copiously irrigated with antibiotic irrigation, one small piece of thrombin-soaked Gelfoam was placed laterally.  No active bleeding or CSF  leakage.  I removed the McCullough retractor, irrigated the paraspinous musculature.  Prior to this, there was 1 cm of excursion of the 4 root medial to pedicle without tension.  We cauterized the paraspinous musculature, closed the dorsolumbar fascia with 1 Vicryl, subcu was irrigated and closed with 2- 0 Vicryl and staples due to his previous incision.  Wound was dressed sterilely, placed supine on the hospital bed, extubated without difficulty and transported to the recovery room in satisfactory condition.  The patient tolerated the procedure well.  No complications.  50 cc of blood loss.  He was transported to the recovery room in satisfactory condition.     Jene Every, M.D.   ______________________________ Jene Every, M.D.    [name]  D:  12/11/2017  T:  12/11/2017  Job:  [id number]

## 2018-01-14 DIAGNOSIS — G4733 Obstructive sleep apnea (adult) (pediatric): Secondary | ICD-10-CM | POA: Diagnosis not present

## 2018-01-14 DIAGNOSIS — E1169 Type 2 diabetes mellitus with other specified complication: Secondary | ICD-10-CM | POA: Diagnosis not present

## 2018-01-20 DIAGNOSIS — M5126 Other intervertebral disc displacement, lumbar region: Secondary | ICD-10-CM | POA: Diagnosis not present

## 2018-01-20 DIAGNOSIS — M48062 Spinal stenosis, lumbar region with neurogenic claudication: Secondary | ICD-10-CM | POA: Diagnosis not present

## 2018-03-02 DIAGNOSIS — M5136 Other intervertebral disc degeneration, lumbar region: Secondary | ICD-10-CM | POA: Diagnosis not present

## 2018-03-02 DIAGNOSIS — E119 Type 2 diabetes mellitus without complications: Secondary | ICD-10-CM | POA: Diagnosis not present

## 2018-05-01 DIAGNOSIS — G4733 Obstructive sleep apnea (adult) (pediatric): Secondary | ICD-10-CM | POA: Diagnosis not present

## 2018-08-03 DIAGNOSIS — Z23 Encounter for immunization: Secondary | ICD-10-CM | POA: Diagnosis not present

## 2018-08-20 DIAGNOSIS — H2513 Age-related nuclear cataract, bilateral: Secondary | ICD-10-CM | POA: Diagnosis not present

## 2018-08-20 DIAGNOSIS — H01021 Squamous blepharitis right upper eyelid: Secondary | ICD-10-CM | POA: Diagnosis not present

## 2018-08-20 DIAGNOSIS — H01022 Squamous blepharitis right lower eyelid: Secondary | ICD-10-CM | POA: Diagnosis not present

## 2018-09-15 DIAGNOSIS — Z0000 Encounter for general adult medical examination without abnormal findings: Secondary | ICD-10-CM | POA: Diagnosis not present

## 2018-09-15 DIAGNOSIS — R82998 Other abnormal findings in urine: Secondary | ICD-10-CM | POA: Diagnosis not present

## 2018-09-22 DIAGNOSIS — E119 Type 2 diabetes mellitus without complications: Secondary | ICD-10-CM | POA: Diagnosis not present

## 2018-09-22 DIAGNOSIS — M5136 Other intervertebral disc degeneration, lumbar region: Secondary | ICD-10-CM | POA: Diagnosis not present

## 2018-09-22 DIAGNOSIS — Z0000 Encounter for general adult medical examination without abnormal findings: Secondary | ICD-10-CM | POA: Diagnosis not present

## 2018-09-22 DIAGNOSIS — E7800 Pure hypercholesterolemia, unspecified: Secondary | ICD-10-CM | POA: Diagnosis not present

## 2018-11-20 DIAGNOSIS — G4733 Obstructive sleep apnea (adult) (pediatric): Secondary | ICD-10-CM | POA: Diagnosis not present

## 2019-01-20 DIAGNOSIS — G4733 Obstructive sleep apnea (adult) (pediatric): Secondary | ICD-10-CM | POA: Diagnosis not present

## 2019-07-29 ENCOUNTER — Other Ambulatory Visit: Payer: Self-pay

## 2019-07-29 DIAGNOSIS — Z20822 Contact with and (suspected) exposure to covid-19: Secondary | ICD-10-CM

## 2019-08-01 ENCOUNTER — Telehealth: Payer: Self-pay | Admitting: Internal Medicine

## 2019-08-01 LAB — NOVEL CORONAVIRUS, NAA: SARS-CoV-2, NAA: NOT DETECTED

## 2019-08-01 NOTE — Telephone Encounter (Signed)
Called pt and LM on VM to call back. 

## 2019-08-01 NOTE — Telephone Encounter (Signed)
Pt states his recent covid test with [facility] is negative but he had a test last week at another facility and tested positive.His wife tested negative at same test facility but has low grade fever.He wants a call back on advice on what he should do.He has no symptoms

## 2019-08-01 NOTE — Telephone Encounter (Signed)
Advised pt and wife to isolate at least 10 days and monitor symptoms.. Advised pt to have his wife to call her PCP to discuss her results/and symptoms. Pt stated that his wife has h/o low grade fevers. Pt verbalized understanding.

## 2019-10-09 ENCOUNTER — Ambulatory Visit: Payer: 59

## 2019-10-29 ENCOUNTER — Ambulatory Visit: Payer: 59 | Attending: Internal Medicine

## 2019-10-29 DIAGNOSIS — Z23 Encounter for immunization: Secondary | ICD-10-CM | POA: Insufficient documentation

## 2019-10-29 NOTE — Progress Notes (Signed)
   Covid-19 Vaccination Clinic  Name:  OSMAR HOWTON    MRN: [id number] DOB: 1955/10/16  10/29/2019  Mr. Plato was observed post Covid-19 immunization for 15 minutes without incident. He was provided with Vaccine Information Sheet and instruction to access the V-Safe system.   Mr. Peddy was instructed to call 911 with any severe reactions post vaccine: Marland Kitchen Difficulty breathing  . Swelling of face and throat  . A fast heartbeat  . A bad rash all over body  . Dizziness and weakness   Immunizations Administered    Name Date Dose VIS Date Route   Pfizer COVID-19 Vaccine 10/29/2019  6:38 PM 0.3 mL 08/06/2019 Intramuscular   Manufacturer: [employer], [employer]   Lot: [id number]   NDC: [address]-7

## 2019-11-30 ENCOUNTER — Ambulatory Visit: Payer: 59

## 2020-12-21 ENCOUNTER — Other Ambulatory Visit: Payer: Self-pay

## 2020-12-21 ENCOUNTER — Ambulatory Visit (INDEPENDENT_AMBULATORY_CARE_PROVIDER_SITE_OTHER): Payer: 59 | Admitting: Family Medicine

## 2020-12-21 ENCOUNTER — Encounter (INDEPENDENT_AMBULATORY_CARE_PROVIDER_SITE_OTHER): Payer: Self-pay | Admitting: Family Medicine

## 2020-12-21 VITALS — BP 126/66 | HR 57 | Temp 98.0°F | Ht 71.0 in | Wt 235.0 lb

## 2020-12-21 DIAGNOSIS — R7401 Elevation of levels of liver transaminase levels: Secondary | ICD-10-CM | POA: Diagnosis not present

## 2020-12-21 DIAGNOSIS — R0602 Shortness of breath: Secondary | ICD-10-CM

## 2020-12-21 DIAGNOSIS — Z1331 Encounter for screening for depression: Secondary | ICD-10-CM

## 2020-12-21 DIAGNOSIS — R5383 Other fatigue: Secondary | ICD-10-CM | POA: Diagnosis not present

## 2020-12-21 DIAGNOSIS — G4733 Obstructive sleep apnea (adult) (pediatric): Secondary | ICD-10-CM

## 2020-12-21 DIAGNOSIS — E559 Vitamin D deficiency, unspecified: Secondary | ICD-10-CM

## 2020-12-21 DIAGNOSIS — E1169 Type 2 diabetes mellitus with other specified complication: Secondary | ICD-10-CM

## 2020-12-21 DIAGNOSIS — Z9189 Other specified personal risk factors, not elsewhere classified: Secondary | ICD-10-CM | POA: Diagnosis not present

## 2020-12-21 DIAGNOSIS — E1165 Type 2 diabetes mellitus with hyperglycemia: Secondary | ICD-10-CM

## 2020-12-21 DIAGNOSIS — Z0289 Encounter for other administrative examinations: Secondary | ICD-10-CM

## 2020-12-21 DIAGNOSIS — E669 Obesity, unspecified: Secondary | ICD-10-CM

## 2020-12-21 DIAGNOSIS — E785 Hyperlipidemia, unspecified: Secondary | ICD-10-CM

## 2020-12-21 DIAGNOSIS — Z6832 Body mass index (BMI) 32.0-32.9, adult: Secondary | ICD-10-CM

## 2020-12-22 LAB — CBC WITH DIFFERENTIAL
Basophils Absolute: 0.1 10*3/uL (ref 0.0–0.2)
Basos: 1 %
EOS (ABSOLUTE): 0.3 10*3/uL (ref 0.0–0.4)
Eos: 5 %
Hematocrit: 44.4 % (ref 37.5–51.0)
Hemoglobin: 15 g/dL (ref 13.0–17.7)
Immature Grans (Abs): 0 10*3/uL (ref 0.0–0.1)
Immature Granulocytes: 1 %
Lymphocytes Absolute: 1.5 10*3/uL (ref 0.7–3.1)
Lymphs: 24 %
MCH: 33.1 pg — ABNORMAL HIGH (ref 26.6–33.0)
MCHC: 33.8 g/dL (ref 31.5–35.7)
MCV: 98 fL — ABNORMAL HIGH (ref 79–97)
Monocytes Absolute: 0.5 10*3/uL (ref 0.1–0.9)
Monocytes: 8 %
Neutrophils Absolute: 3.9 10*3/uL (ref 1.4–7.0)
Neutrophils: 61 %
RBC: 4.53 x10E6/uL (ref 4.14–5.80)
RDW: 13.1 % (ref 11.6–15.4)
WBC: 6.4 10*3/uL (ref 3.4–10.8)

## 2020-12-22 LAB — T4: T4, Total: 8 ug/dL (ref 4.5–12.0)

## 2020-12-22 LAB — INSULIN, RANDOM: INSULIN: 19.2 u[IU]/mL (ref 2.6–24.9)

## 2020-12-22 LAB — T3: T3, Total: 123 ng/dL (ref 71–180)

## 2020-12-22 LAB — TSH: TSH: 2.37 u[IU]/mL (ref 0.450–4.500)

## 2020-12-22 LAB — FOLATE: Folate: 6.3 ng/mL (ref >3.0)

## 2020-12-22 LAB — VITAMIN B12: Vitamin B-12: 439 pg/mL (ref 232–1245)

## 2020-12-25 NOTE — Progress Notes (Signed)
Chief Complaint:   OBESITY [name] Wood (MR# [id number]) is a 65 y.o. male who presents for evaluation and treatment of obesity and related comorbidities. Current BMI is Body mass index is 32.78 kg/m. [name] Wood has been struggling with his weight for many years and has been unsuccessful in either losing weight, maintaining weight loss, or reaching his healthy weight goal.  [name] Wood is currently in the action stage of change and ready to dedicate time achieving and maintaining a healthier weight. [name] Wood is interested in becoming our patient and working on intensive lifestyle modifications including (but not limited to) diet and exercise for weight loss.  Pt's wife and daughter are joining Financial risk analyst. They are all working on healthier lifestyles. Recent weight loss (pt did moderation and variety in daily diet). Breakfast is his favorite meal of the day. Breakfast- 2 eggs, [place] sausage and toast and coffee (sugar free creamer); more coffee through day; Lunch-taco salad during work or banana/apple; Futures trader- chicken +/- spaghetti or breakfast for dinner, and if eating out, [employer] chicken and salad or [employer] bean burrito.  [name] Wood's habits were reviewed today and are as follows: His family eats meals together, he thinks his family will eat healthier with him, his desired weight loss is 36 lbs, he started gaining weight after age 54, his heaviest weight ever was 255 pounds, he snacks frequently in the evenings, he skips meals frequently, he is frequently drinking liquids with calories, he frequently makes poor food choices, he frequently eats larger portions than normal, he has binge eating behaviors and he struggles with emotional eating.  Depression Screen [name] Wood's Food and Mood (modified PHQ-9) score was 3.  Depression screen [facility] 2/9 12/21/2020  Decreased Interest 1  Down, Depressed, Hopeless 1  PHQ - 2 Score 2  Altered sleeping 0  Tired, decreased energy 0  Change in appetite  1  Feeling bad or failure about yourself  0  Trouble concentrating 0  Moving slowly or fidgety/restless 0  Suicidal thoughts 0  PHQ-9 Score 3  Difficult doing work/chores Not difficult at all   Subjective:   1. Other fatigue Aidric admits to daytime somnolence and admits to waking up still tired. Patent has a history of symptoms of daytime fatigue. [name] Wood generally gets 8 or 9 hours of sleep per night, and states that he has generally restful sleep. Snoring is present. Apneic episodes are not present. Epworth Sleepiness Score is 3. EKG normal sinus rhythm at 61 bpm.  2. SOB (shortness of breath) on exertion [name] Wood notes increasing shortness of breath with exercising and seems to be worsening over time with weight gain. He notes getting out of breath sooner with activity than he used to. This has gotten worse recently. [name] Wood denies shortness of breath at rest or orthopnea. EKG normal sinus rhythm at 61 bpm.  3. Transaminitis Pt's last LFT's revealed AST 52, ALT 51, and alkaline phosphate 90. No ultrasound on file. Likely NAFLD.  4. Type 2 diabetes mellitus with hyperglycemia, without long-term current use of insulin (HCC) Pt's A1c was 6.5 on 10/24/2020 (previously at 8.7). he is on Metformin daily. Diagnosed in November 2021. Pt is overdue for eye exam. His PCP checks his feet.  5. Hyperlipidemia associated with type 2 diabetes mellitus (HCC) Pt's LDL 91, HDL 50, and triglycerides 88. He is on pravastatin.  6. OSA (obstructive sleep apnea) Pt was diagnosed about 10 years ago. Her has a CPAP and wears it constantly.  7. Vitamin D deficiency  Pt's last Vit D level was 24.4 on 10/30/2020. He reports fatigue. Pt is on OTC Vit D 1,000 IU daily.  8. At risk for deficient intake of food Lyden is at risk for deficient intake of food due to skipping lunch frequently.  Assessment/Plan:   1. Other fatigue Edwin does feel that his weight is causing his energy to be lower than it should  be. Fatigue may be related to obesity, depression or many other causes. Labs will be ordered, and in the meanwhile, Karol will focus on self care including making healthy food choices, increasing physical activity and focusing on stress reduction. Check labs today.  - Vitamin B12 - TSH - T3 - Folate - T4 - EKG 12-Lead  2. SOB (shortness of breath) on exertion Takota does feel that he gets out of breath more easily that he used to when he exercises. Dontre's shortness of breath appears to be obesity related and exercise induced. He has agreed to work on weight loss and gradually increase exercise to treat his exercise induced shortness of breath. Will continue to monitor closely. Check labs today.  - CBC With Differential  3. Transaminitis Repeat labs in 3 months.  4. Type 2 diabetes mellitus with hyperglycemia, without long-term current use of insulin (HCC) Good blood sugar control is important to decrease the likelihood of diabetic complications such as nephropathy, neuropathy, limb loss, blindness, coronary artery disease, and death. Intensive lifestyle modification including diet, exercise and weight loss are the first line of treatment for diabetes. Check labs today.  - Insulin, random  5. Hyperlipidemia associated with type 2 diabetes mellitus (HCC) Cardiovascular risk and specific lipid/LDL goals reviewed.  We discussed several lifestyle modifications today and Yaseen will continue to work on diet, exercise and weight loss efforts. Orders and follow up as documented in patient record. Repeat labs in 3 months.  Counseling Intensive lifestyle modifications are the first line treatment for this issue. Dietary changes: Increase soluble fiber. Decrease simple carbohydrates. Exercise changes: Moderate to vigorous-intensity aerobic activity 150 minutes per week if tolerated. Lipid-lowering medications: see documented in medical record.  6. OSA (obstructive sleep apnea) Intensive  lifestyle modifications are the first line treatment for this issue. We discussed several lifestyle modifications today and he will continue to work on diet, exercise and weight loss efforts. We will continue to monitor. Orders and follow up as documented in patient record. Follow up at next appointment.  7. Vitamin D deficiency Low Vitamin D level contributes to fatigue and are associated with obesity, breast, and colon cancer. He agrees to continue to take OTC Vitamin D @1 ,000 IU daily and will follow-up for routine testing of Vitamin D, at least 2-3 times per year to avoid over-replacement. Repeat labs in 3 months.  8. Screening for depression Oluwatobi had a negative depression screening. Depression is commonly associated with obesity and often results in emotional eating behaviors. We will monitor this closely and work on CBT to help improve the non-hunger eating patterns. Referral to Psychology may be required if no improvement is seen as he continues in our clinic.  9. At risk for deficient intake of food Cashis was given approximately 15 minutes of deficit intake of food prevention counseling today. Chioke is at risk for eating too few calories based on current food recall. He was encouraged to focus on meeting caloric and protein goals according to his recommended meal plan.   10. Class 1 obesity with serious comorbidity and body mass index (BMI) of 32.0 to  32.9 in adult, unspecified obesity type  [name] LitesGregory is currently in the action stage of change and his goal is to continue with weight loss efforts. I recommend [name] LitesGregory begin the structured treatment plan as follows:  He has agreed to the Category 2 Plan  + 100 calories and 8 oz protein.  Exercise goals: No exercise has been prescribed at this time.   Behavioral modification strategies: increasing lean protein intake, no skipping meals, meal planning and cooking strategies and planning for success.  He was informed of the importance of  frequent follow-up visits to maximize his success with intensive lifestyle modifications for his multiple health conditions. He was informed we would discuss his lab results at his next visit unless there is a critical issue that needs to be addressed sooner. [name] LitesGregory agreed to keep his next visit at the agreed upon time to discuss these results.  Objective:   Blood pressure 126/66, pulse (!) 57, temperature 98 F (36.7 C), height 5\' 11"  (1.803 m), weight 235 lb (106.6 kg), SpO2 98 %. Body mass index is 32.78 kg/m.  EKG: Normal sinus rhythm, rate 61.  Indirect Calorimeter completed today shows a VO2 of 200 and a REE of 1389.  His calculated basal metabolic rate is [id number] thus his basal metabolic rate is worse than expected.  General: Cooperative, alert, well developed, in no acute distress. HEENT: Conjunctivae and lids unremarkable. Cardiovascular: Regular rhythm.  Lungs: Normal work of breathing. Neurologic: No focal deficits.   Lab Results  Component Value Date   CREATININE 1.12 12/04/2017   BUN 14 12/04/2017   NA 140 12/04/2017   K 4.2 12/04/2017   CL 109 12/04/2017   CO2 20 (L) 12/04/2017   No results found for: ALT, AST, GGT, ALKPHOS, BILITOT No results found for: HGBA1C Lab Results  Component Value Date   INSULIN 19.2 12/21/2020   Lab Results  Component Value Date   TSH 2.370 12/21/2020   No results found for: CHOL, HDL, LDLCALC, LDLDIRECT, TRIG, CHOLHDL Lab Results  Component Value Date   WBC 6.4 12/21/2020   HGB 15.0 12/21/2020   HCT 44.4 12/21/2020   MCV 98 (H) 12/21/2020   PLT 208 12/04/2017    Attestation Statements:   Reviewed by clinician on day of visit: allergies, medications, problem list, medical history, surgical history, family history, social history, and previous encounter notes.  Edmund HildaI, Tamesha Frazier, am acting as transcriptionist for Reuben LikesAlexandria Jadin Kagel, MD.   This is the patient's first visit at [facility]. The patient's NEW  PATIENT PACKET was reviewed at length. Included in the packet: current and past health history, medications, allergies, ROS, gynecologic history (women only), surgical history, family history, social history, weight history, weight loss surgery history (for those that have had weight loss surgery), nutritional evaluation, mood and food questionnaire, PHQ9, Epworth questionnaire, sleep habits questionnaire, patient life and health improvement goals questionnaire. These will all be scanned into the patient's chart under media.   During the visit, I independently reviewed the patient's EKG, bioimpedance scale results, and indirect calorimeter results. I used this information to tailor a meal plan for the patient that will help him to lose weight and will improve his obesity-related conditions going forward. I performed a medically necessary appropriate examination and/or evaluation. I discussed the assessment and treatment plan with the patient. The patient was provided an opportunity to ask questions and all were answered. The patient agreed with the plan and demonstrated an understanding of the instructions. Labs were ordered at  this visit and will be reviewed at the next visit unless more critical results need to be addressed immediately. Clinical information was updated and documented in the EMR.   Time spent on visit including pre-visit chart review and post-visit care was 45 minutes.   A separate 15 minutes was spent on risk counseling (see above).  I have reviewed the above documentation for accuracy and completeness, and I agree with the above. - Katherina Mires, MD

## 2021-01-04 ENCOUNTER — Encounter (INDEPENDENT_AMBULATORY_CARE_PROVIDER_SITE_OTHER): Payer: Self-pay | Admitting: Family Medicine

## 2021-01-04 ENCOUNTER — Ambulatory Visit (INDEPENDENT_AMBULATORY_CARE_PROVIDER_SITE_OTHER): Payer: 59 | Admitting: Family Medicine

## 2021-01-04 ENCOUNTER — Other Ambulatory Visit: Payer: Self-pay

## 2021-01-04 VITALS — BP 154/69 | HR 55 | Temp 97.8°F | Ht 71.0 in | Wt 237.0 lb

## 2021-01-04 DIAGNOSIS — E1165 Type 2 diabetes mellitus with hyperglycemia: Secondary | ICD-10-CM | POA: Diagnosis not present

## 2021-01-04 DIAGNOSIS — E785 Hyperlipidemia, unspecified: Secondary | ICD-10-CM

## 2021-01-04 DIAGNOSIS — E559 Vitamin D deficiency, unspecified: Secondary | ICD-10-CM

## 2021-01-04 DIAGNOSIS — R7401 Elevation of levels of liver transaminase levels: Secondary | ICD-10-CM | POA: Diagnosis not present

## 2021-01-04 DIAGNOSIS — E1169 Type 2 diabetes mellitus with other specified complication: Secondary | ICD-10-CM | POA: Diagnosis not present

## 2021-01-04 DIAGNOSIS — Z6832 Body mass index (BMI) 32.0-32.9, adult: Secondary | ICD-10-CM

## 2021-01-04 DIAGNOSIS — Z9189 Other specified personal risk factors, not elsewhere classified: Secondary | ICD-10-CM

## 2021-01-04 DIAGNOSIS — E669 Obesity, unspecified: Secondary | ICD-10-CM

## 2021-01-04 MED ORDER — VITAMIN D (ERGOCALCIFEROL) 1.25 MG (50000 UNIT) PO CAPS: 50000.0000 [IU] | ORAL_CAPSULE | ORAL | 0 refills | Status: DC

## 2021-01-08 NOTE — Progress Notes (Signed)
Chief Complaint:   OBESITY [name] Wood is here to discuss his progress with his obesity treatment plan along with follow-up of his obesity related diagnoses. [name] Wood is on the Category 2 Plan + 100 calories with 8 oz protein and states he is following his eating plan approximately 90% of the time. [name] Wood states he is not currently exercising.  Today's visit was #: 2 Starting weight: 235 lbs Starting date: 12/21/2020 Today's weight: 237 lbs Today's date: 01/04/2021 Total lbs lost to date: 0 Total lbs lost since last in-office visit: 0  Interim History: [name] Wood said scale feels like he is up. He's not used to eating lunch or larger portion at dinner. He is getting all food in most days but some days he is not. Daily quantity of food has been his biggest obstacle. He is eating little Debbie Oatmeal pie for snacks. He is doing full portions when eating. The next few weeks is much of the dame in terms of routine.  Subjective:   1. Type 2 diabetes mellitus with hyperglycemia, without long-term current use of insulin (HCC) A1c 6.5 on 10/24/2020 and insulin level 19.2. [name] Wood is on Metformin 500 mg daily.  2. Transaminitis Last AST 52, ALT 51, and alkaline phosphate 90. No ultrasound on file.  3. Vitamin D deficiency [name] Wood reports fatigue. His last Vit D level was 24.4 on 3//2022. He is taking Vit D 1000 IU daily.  4. Hyperlipidemia associated with type 2 diabetes mellitus (HCC) LDL 91, HDL 50, and triglycerides 88. [name] Wood is on Pravastatin 40 mg daily. Positive transaminitis.  5. At risk for osteoporosis [name] Wood is at higher risk of osteopenia and osteoporosis due to Vitamin D deficiency.   Assessment/Plan:   1. Type 2 diabetes mellitus with hyperglycemia, without long-term current use of insulin (HCC) Good blood sugar control is important to decrease the likelihood of diabetic complications such as nephropathy, neuropathy, limb loss, blindness, coronary artery disease, and death.  Intensive lifestyle modification including diet, exercise and weight loss are the first line of treatment for diabetes. Pt to consider GLP-1 medication. Continue Metformin.  2. Transaminitis Repeat labs in 3  Months.  3. Vitamin D deficiency Low Vitamin D level contributes to fatigue and are associated with obesity, breast, and colon cancer. He agrees to start to take prescription Vitamin D @50 ,000 IU every week and will follow-up for routine testing of Vitamin D, at least 2-3 times per year to avoid over-replacement.  - Vitamin D, Ergocalciferol, (DRISDOL) 1.25 MG (50000 UNIT) CAPS capsule; Take 1 capsule (50,000 Units total) by mouth every 7 (seven) days.  Dispense: 4 capsule; Refill: 0  4. Hyperlipidemia associated with type 2 diabetes mellitus (HCC) Cardiovascular risk and specific lipid/LDL goals reviewed.  We discussed several lifestyle modifications today and [name] Wood will continue to work on diet, exercise and weight loss efforts. Orders and follow up as documented in patient record. Repeat labs in 3 months.  Counseling Intensive lifestyle modifications are the first line treatment for this issue. . Dietary changes: Increase soluble fiber. Decrease simple carbohydrates. . Exercise changes: Moderate to vigorous-intensity aerobic activity 150 minutes per week if tolerated. . Lipid-lowering medications: see documented in medical record.  5. At risk for osteoporosis [name] Wood was given approximately 30 minutes of osteoporosis prevention counseling today. [name] Wood is at risk for osteopenia and osteoporosis due to his Vitamin D deficiency. He was encouraged to take his Vitamin D and follow his higher calcium diet and increase strengthening exercise to help strengthen his bones and  decrease his risk of osteopenia and osteoporosis.  Repetitive spaced learning was employed today to elicit superior memory formation and behavioral change.  6. Class 1 obesity with serious comorbidity and body mass  index (BMI) of 32.0 to 32.9 in adult, unspecified obesity type  [name] Wood is currently in the action stage of change. As such, his goal is to continue with weight loss efforts. He has agreed to the Category 2 Plan + 100 calories with 8 oz meat..   Exercise goals: No exercise has been prescribed at this time.  Behavioral modification strategies: increasing lean protein intake, meal planning and cooking strategies, keeping healthy foods in the home and planning for success.  [name] Wood has agreed to follow-up with our clinic in 2-3 weeks. He was informed of the importance of frequent follow-up visits to maximize his success with intensive lifestyle modifications for his multiple health conditions.   Objective:   Blood pressure (!) 154/69, pulse (!) 55, temperature 97.8 F (36.6 C), height 5\' 11"  (1.803 m), weight 237 lb (107.5 kg), SpO2 99 %. Body mass index is 33.05 kg/m.  General: Cooperative, alert, well developed, in no acute distress. HEENT: Conjunctivae and lids unremarkable. Cardiovascular: Regular rhythm.  Lungs: Normal work of breathing. Neurologic: No focal deficits.   Lab Results  Component Value Date   CREATININE 1.12 12/04/2017   BUN 14 12/04/2017   NA 140 12/04/2017   K 4.2 12/04/2017   CL 109 12/04/2017   CO2 20 (L) 12/04/2017   No results found for: ALT, AST, GGT, ALKPHOS, BILITOT No results found for: HGBA1C Lab Results  Component Value Date   INSULIN 19.2 12/21/2020   Lab Results  Component Value Date   TSH 2.370 12/21/2020   No results found for: CHOL, HDL, LDLCALC, LDLDIRECT, TRIG, CHOLHDL Lab Results  Component Value Date   WBC 6.4 12/21/2020   HGB 15.0 12/21/2020   HCT 44.4 12/21/2020   MCV 98 (H) 12/21/2020   PLT 208 12/04/2017    Attestation Statements:   Reviewed by clinician on day of visit: allergies, medications, problem list, medical history, surgical history, family history, social history, and previous encounter notes.  02/03/2018, CMA, am acting as transcriptionist for Edmund Hilda, MD.   I have reviewed the above documentation for accuracy and completeness, and I agree with the above. - Reuben Likes, MD

## 2021-01-24 ENCOUNTER — Ambulatory Visit (INDEPENDENT_AMBULATORY_CARE_PROVIDER_SITE_OTHER): Payer: 59 | Admitting: Family Medicine

## 2021-02-07 ENCOUNTER — Other Ambulatory Visit: Payer: Self-pay

## 2021-02-07 ENCOUNTER — Encounter: Payer: Self-pay | Admitting: Cardiology

## 2021-02-07 ENCOUNTER — Ambulatory Visit: Payer: 59 | Admitting: Cardiology

## 2021-02-07 VITALS — BP 155/78 | HR 66 | Temp 98.5°F | Ht 71.0 in | Wt 248.0 lb

## 2021-02-07 DIAGNOSIS — E7800 Pure hypercholesterolemia, unspecified: Secondary | ICD-10-CM

## 2021-02-07 DIAGNOSIS — R011 Cardiac murmur, unspecified: Secondary | ICD-10-CM

## 2021-02-07 DIAGNOSIS — E1165 Type 2 diabetes mellitus with hyperglycemia: Secondary | ICD-10-CM

## 2021-02-07 DIAGNOSIS — R0989 Other specified symptoms and signs involving the circulatory and respiratory systems: Secondary | ICD-10-CM

## 2021-02-07 MED ORDER — ROSUVASTATIN CALCIUM 20 MG PO TABS: 20.0000 mg | ORAL_TABLET | Freq: Every day | ORAL | 2 refills | Status: DC

## 2021-02-07 MED ORDER — OLMESARTAN MEDOXOMIL 20 MG PO TABS: 20.0000 mg | ORAL_TABLET | Freq: Every evening | ORAL | 2 refills | Status: DC

## 2021-02-07 NOTE — Progress Notes (Signed)
Primary Physician/Referring:  Haywood Pao, MD  Patient ID: [name] Wood, male    DOB: May 17, 1956, 65 y.o.   MRN: [id number]  Chief Complaint  Patient presents with   New Patient (Initial Visit)   Establish Care   HPI:    [name] Wood  is a 65 y.o. Caucasian male patient with diabetes mellitus, hyperlipidemia, fatty liver, obesity referred to me for cardiac risk stratification.  Patient remains asymptomatic but admits to being not as active physically and not walking as they recently moved to a new house 6 months ago.  He remains asymptomatic.  He is presently enrolled at [facility] wellness clinic.  Past Medical History:  Diagnosis Date   Back pain    Hyperlipidemia    Muscle stiffness    Other fatigue    Sleep apnea    use CPAP   SOB (shortness of breath) on exertion    Type 2 diabetes mellitus ([place])    Past Surgical History:  Procedure Laterality Date   BACK SURGERY     COLONOSCOPY     LUMBAR LAMINECTOMY/DECOMPRESSION MICRODISCECTOMY Left 10/02/2016   Procedure: Microlumbar decompression L4-5 Left, L5-S1 Left;  Surgeon: Susa Day, MD;  Location: [facility];  Service: Orthopedics;  Laterality: Left;   LUMBAR LAMINECTOMY/DECOMPRESSION MICRODISCECTOMY N/A 12/11/2017   Procedure: Microlumbar decompression Lumbar Three- Four;  Surgeon: Susa Day, MD;  Location: [place];  Service: Orthopedics;  Laterality: N/A;  Microlumbar decompression Lumbar Three- Four   NO PAST SURGERIES     Family History  Problem Relation Age of Onset   Hypertension Mother    Thyroid disease Mother    Cancer Mother    Alcoholism Mother    Cancer Father    Depression Father     Social History   Tobacco Use   Smoking status: Former    Pack years: 0.00    Types: Cigarettes    Quit date: 06/06/1984    Years since quitting: 36.6   Smokeless tobacco: Never  Substance Use Topics   Alcohol use: Yes    Alcohol/week: 1.0 standard drink    Types: 1 Standard drinks or equivalent per  week    Comment: rarley   Marital Status: Married  ROS  Review of Systems  Constitutional: Positive for weight gain.  Cardiovascular:  Negative for chest pain, dyspnea on exertion and leg swelling.  Gastrointestinal:  Negative for melena.  Objective  Blood pressure (!) 155/78, pulse 66, temperature 98.5 F (36.9 C), height _0  (1.803 m), weight 248 lb (112.5 kg), SpO2 98 %. Body mass index is 34.59 kg/m.  Vitals with BMI 02/07/2021 01/04/2021 12/21/2020  Height _1  _2  _3   Weight 248 lbs 237 lbs 235 lbs  BMI 34.6 08.67 61.95  Systolic 093 267 124  Diastolic 78 69 66  Pulse 66 55 57     Physical Exam Constitutional:      Appearance: He is obese.  Neck:     Vascular: No carotid bruit or JVD.  Cardiovascular:     Rate and Rhythm: Normal rate and regular rhythm.     Pulses: Intact distal pulses.     Heart sounds: Normal heart sounds. No murmur heard.   No gallop.  Pulmonary:     Effort: Pulmonary effort is normal.     Breath sounds: Normal breath sounds.  Abdominal:     General: Bowel sounds are normal.     Palpations: Abdomen is soft.  Musculoskeletal:  General: No swelling.     Laboratory examination:   No results for input(s): NA, K, CL, CO2, GLUCOSE, BUN, CREATININE, CALCIUM, GFRNONAA, GFRAA in the last 8760 hours. CrCl cannot be calculated (Patient's most recent lab result is older than the maximum 21 days allowed.).  CMP Latest Ref Rng & Units 12/04/2017 09/24/2016  Glucose 65 - 99 mg/dL 131(H) 103(H)  BUN 6 - 20 mg/dL 14 24(H)  Creatinine 0.61 - 1.24 mg/dL 1.12 0.87  Sodium 135 - 145 mmol/L 140 139  Potassium 3.5 - 5.1 mmol/L 4.2 4.6  Chloride 101 - 111 mmol/L 109 108  CO2 22 - 32 mmol/L 20(L) 24  Calcium 8.9 - 10.3 mg/dL 9.2 9.1   CBC Latest Ref Rng & Units 12/21/2020 12/04/2017 09/24/2016  WBC 3.4 - 10.8 x10E3/uL 6.4 6.5 9.1  Hemoglobin 13.0 - 17.7 g/dL 15.0 15.0 14.1  Hematocrit 37.5 - 51.0 % 44.4 42.8 39.4  Platelets 150 - 400 K/uL - 208  210    Lipid Panel No results for input(s): CHOL, TRIG, LDLCALC, VLDL, HDL, CHOLHDL, LDLDIRECT in the last 8760 hours. Lipid Panel  No results found for: CHOL, TRIG, HDL, CHOLHDL, VLDL, LDLCALC, LDLDIRECT, LABVLDL   HEMOGLOBIN A1C No results found for: HGBA1C, MPG TSH Recent Labs    12/21/20 1001  TSH 2.370    External labs:   Labs 10/30/2020:  TSH 2.37.  A1c 6.5%, reduced from 8.7% previous to that.  Vitamin D 24.4.  Serum glucose 132 mg, BUN 11, creatinine 0.8, potassium 4.4, LFT mildly abnormal at AST 52 (7-45) and ALT 51 (5-40).  Total cholesterol 159, triglycerides 88, HDL 50, LDL 91.  Medications and allergies  No Known Allergies    Medication prior to this encounter:   Outpatient Medications Prior to Visit  Medication Sig Dispense Refill   metFORMIN (GLUCOPHAGE) 500 MG tablet Take by mouth 2 (two) times daily with a meal.     pravastatin (PRAVACHOL) 40 MG tablet Take 40 mg by mouth daily.     cholecalciferol (VITAMIN D) 25 MCG (1000 UNIT) tablet Take 1,000 Units by mouth daily.     Vitamin D, Ergocalciferol, (DRISDOL) 1.25 MG (50000 UNIT) CAPS capsule Take 1 capsule (50,000 Units total) by mouth every 7 (seven) days. 4 capsule 0   No facility-administered medications prior to visit.     FINAL MEDICATION AS OF TODAY:   Medications after current encounter Current Outpatient Medications  Medication Instructions   cholecalciferol (VITAMIN D) 1,000 Units, Oral, Daily   metFORMIN (GLUCOPHAGE) 500 MG tablet Oral, 2 times daily with meals   olmesartan (BENICAR) 20 mg, Oral, Every evening   rosuvastatin (CRESTOR) 20 mg, Oral, Daily    Radiology:   No results found.  Cardiac Studies:   None EKG:     EKG 02/07/2021: Sinus bradycardia at rate of 59 bpm, otherwise normal EKG.       Assessment     ICD-10-CM   1. Hypercholesteremia  E78.00 Lipid Panel With LDL/HDL Ratio    2. Type 2 diabetes mellitus with hyperglycemia, without long-term current use of  insulin (HCC)  E11.65 EKG 12-Lead    CT CARDIAC SCORING ([facility] LOCATIONS ONLY)    olmesartan (BENICAR) 20 MG tablet    3. Murmur  R01.1 PCV ECHOCARDIOGRAM COMPLETE    4. Labile hypertension  R09.89 CMP14+EGFR    rosuvastatin (CRESTOR) 20 MG tablet    olmesartan (BENICAR) 20 MG tablet       Medications Discontinued During This Encounter  Medication Reason  Vitamin D, Ergocalciferol, (DRISDOL) 1.25 MG (50000 UNIT) CAPS capsule Error   pravastatin (PRAVACHOL) 40 MG tablet Change in therapy    Meds ordered this encounter  Medications   rosuvastatin (CRESTOR) 20 MG tablet    Sig: Take 1 tablet (20 mg total) by mouth daily.    Dispense:  30 tablet    Refill:  2   olmesartan (BENICAR) 20 MG tablet    Sig: Take 1 tablet (20 mg total) by mouth every evening.    Dispense:  30 tablet    Refill:  2   Orders Placed This Encounter  Procedures   CT CARDIAC SCORING ([facility] LOCATIONS ONLY)    Standing Status:   Future    Standing Expiration Date:   04/09/2021    Order Specific Question:   Preferred imaging location?    Answer:   [facility]   Lipid Panel With LDL/HDL Ratio   CMP14+EGFR   EKG 12-Lead   PCV ECHOCARDIOGRAM COMPLETE    Standing Status:   Future    Standing Expiration Date:   02/07/2022   Recommendations:   [name] Wood is a 65 y.o. Caucasian male patient with diabetes mellitus, hyperlipidemia, fatty liver, obesity referred to me for cardiac risk stratification.  Patient remains asymptomatic but admits to being not as active physically and not walking as they recently moved to a new house 6 months ago.  His physical examination except for systolic ejection murmur in the right sternal border and mild obesity is normal.  His blood pressure is elevated today and also in the wellness clinic a month ago.  Suspect he may have pre-hypertension and may also be related to recent weight gain and stress related to his work and family lifestyle.  However in view of diabetes state I will start  him on Benicar 20 mg in the evening.  Reviewed his external labs, although he has mildly abnormal LFTs, would like to start him on Crestor 20 mg daily and goal LDL <70 in view of diabetes mellitus.  I will obtain a CMP and lipids in about a month.  Echocardiogram to evaluate murmur and coronary calcium score for cardiac restratification.  Office visit in 3 months. He is presently enrolled at [facility] wellness clinic.    Adrian Prows, MD, [facility] 02/07/2021, 12:02 PM Office: [phone]

## 2021-02-14 ENCOUNTER — Ambulatory Visit (INDEPENDENT_AMBULATORY_CARE_PROVIDER_SITE_OTHER): Payer: 59 | Admitting: Family Medicine

## 2021-02-22 ENCOUNTER — Other Ambulatory Visit: Payer: Self-pay

## 2021-02-22 ENCOUNTER — Ambulatory Visit: Payer: 59

## 2021-02-22 DIAGNOSIS — R011 Cardiac murmur, unspecified: Secondary | ICD-10-CM

## 2021-02-27 NOTE — Progress Notes (Signed)
Echocardiogram 02/21/2021:  Normal LV systolic function with EF 65%. Left ventricle cavity is normal in size. Mild concentric hypertrophy of the left ventricle. Normal global wall motion. Normal diastolic filling pattern. Calculated EF 65%.  Left atrial cavity is moderately dilated at 4.4 cm.  Trileaflet aortic valve. No evidence of aortic stenosis. Mild (Grade I) aortic regurgitation.  Structurally normal tricuspid valve. Mild tricuspid regurgitation. Mild pulmonary hypertension. RVSP measures  34 mmHg.

## 2021-03-05 ENCOUNTER — Ambulatory Visit (INDEPENDENT_AMBULATORY_CARE_PROVIDER_SITE_OTHER): Payer: 59 | Admitting: Family Medicine

## 2021-03-19 ENCOUNTER — Ambulatory Visit
Admission: RE | Admit: 2021-03-19 | Discharge: 2021-03-19 | Disposition: A | Discharge status: Home / Self Care | Payer: No Typology Code available for payment source | Source: Ambulatory Visit | Attending: Cardiology | Admitting: Cardiology

## 2021-03-19 DIAGNOSIS — E1165 Type 2 diabetes mellitus with hyperglycemia: Secondary | ICD-10-CM

## 2021-03-20 NOTE — Progress Notes (Signed)
CORONARY CALCIUM SCORES 03/19/2021: : Left Main: 10.5 LAD: 18.7 LCx: 117 RCA: 0 Total Agatston Score: 146. MESA database percentile: 43  AORTA MEASUREMENTS: Ascending Aorta: 40 mm. Descending Aorta: 26 mm

## 2021-04-11 LAB — LIPID PANEL WITH LDL/HDL RATIO
Cholesterol, Total: 135 mg/dL (ref 100–199)
HDL: 45 mg/dL (ref >39)
LDL Chol Calc (NIH): 69 mg/dL (ref 0–99)
LDL/HDL Ratio: 1.5 ratio (ref 0.0–3.6)
Triglycerides: 115 mg/dL (ref 0–149)
VLDL Cholesterol Cal: 21 mg/dL (ref 5–40)

## 2021-04-11 LAB — CMP14+EGFR
ALT: 30 IU/L (ref 0–44)
AST: 45 IU/L — ABNORMAL HIGH (ref 0–40)
Albumin/Globulin Ratio: 1.9 (ref 1.2–2.2)
Albumin: 4.3 g/dL (ref 3.8–4.8)
Alkaline Phosphatase: 102 IU/L (ref 44–121)
BUN/Creatinine Ratio: 13 (ref 10–24)
BUN: 14 mg/dL (ref 8–27)
Bilirubin Total: 0.7 mg/dL (ref 0.0–1.2)
CO2: 21 mmol/L (ref 20–29)
Calcium: 9.1 mg/dL (ref 8.6–10.2)
Chloride: 103 mmol/L (ref 96–106)
Creatinine, Ser: 1.11 mg/dL (ref 0.76–1.27)
Globulin, Total: 2.3 g/dL (ref 1.5–4.5)
Glucose: 165 mg/dL — ABNORMAL HIGH (ref 65–99)
Potassium: 4.9 mmol/L (ref 3.5–5.2)
Sodium: 139 mmol/L (ref 134–144)
Total Protein: 6.6 g/dL (ref 6.0–8.5)
eGFR: 75 mL/min/{1.73_m2} (ref >59)

## 2021-04-17 ENCOUNTER — Other Ambulatory Visit: Payer: Self-pay | Admitting: Urology

## 2021-04-17 DIAGNOSIS — Z8051 Family history of malignant neoplasm of kidney: Secondary | ICD-10-CM

## 2021-05-08 ENCOUNTER — Ambulatory Visit
Admission: RE | Admit: 2021-05-08 | Discharge: 2021-05-08 | Disposition: A | Discharge status: Home / Self Care | Payer: 59 | Source: Ambulatory Visit | Attending: Urology | Admitting: Urology

## 2021-05-08 DIAGNOSIS — Z8051 Family history of malignant neoplasm of kidney: Secondary | ICD-10-CM

## 2021-05-10 ENCOUNTER — Encounter: Payer: Self-pay | Admitting: Cardiology

## 2021-05-10 ENCOUNTER — Other Ambulatory Visit: Payer: Self-pay

## 2021-05-10 ENCOUNTER — Ambulatory Visit: Payer: 59 | Admitting: Cardiology

## 2021-05-10 VITALS — BP 142/72 | HR 56 | Temp 97.5°F | Resp 17 | Ht 71.0 in | Wt 247.8 lb

## 2021-05-10 DIAGNOSIS — E7800 Pure hypercholesterolemia, unspecified: Secondary | ICD-10-CM

## 2021-05-10 DIAGNOSIS — E1165 Type 2 diabetes mellitus with hyperglycemia: Secondary | ICD-10-CM

## 2021-05-10 DIAGNOSIS — I10 Essential (primary) hypertension: Secondary | ICD-10-CM

## 2021-05-10 MED ORDER — ROSUVASTATIN CALCIUM 20 MG PO TABS: 20.0000 mg | ORAL_TABLET | Freq: Every day | ORAL | 3 refills | Status: DC

## 2021-05-10 MED ORDER — OLMESARTAN MEDOXOMIL 40 MG PO TABS: 40.0000 mg | ORAL_TABLET | Freq: Every evening | ORAL | 3 refills | Status: DC

## 2021-05-10 NOTE — Progress Notes (Signed)
Primary Physician/Referring:  Gaspar Garbe, MD  Patient ID: [name] Wood, male    DOB: April 07, 1956, 65 y.o.   MRN: [id number]  Chief Complaint  Patient presents with   Hypertension    3 months   Hyperlipidemia   Heart Murmur   HPI:    [name] Wood  is a 65 y.o. Caucasian male patient with diabetes mellitus, hyperlipidemia, fatty liver, obesity referred to me for cardiac risk stratification.  Patient remains asymptomatic but admits to being not as active physically and not walking as they recently moved to a new house 6 months ago.  He remains asymptomatic.  He is presently enrolled at [facility] wellness clinic.  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  Past Medical History:  Diagnosis Date   Back pain    Hyperlipidemia    Muscle stiffness    Other fatigue    Sleep apnea    use CPAP   SOB (shortness of breath) on exertion    Type 2 diabetes mellitus (HCC)    Past Surgical History:  Procedure Laterality Date   BACK SURGERY     COLONOSCOPY     LUMBAR LAMINECTOMY/DECOMPRESSION MICRODISCECTOMY Left 10/02/2016   Procedure: Microlumbar decompression L4-5 Left, L5-S1 Left;  Surgeon: Jene Every, MD;  Location: [facility];  Service: Orthopedics;  Laterality: Left;   LUMBAR LAMINECTOMY/DECOMPRESSION MICRODISCECTOMY N/A 12/11/2017   Procedure: Microlumbar decompression Lumbar Three- Four;  Surgeon: Jene Every, MD;  Location: [facility];  Service: Orthopedics;  Laterality: N/A;  Microlumbar decompression Lumbar Three- Four   NO PAST SURGERIES     Family History  Problem Relation Age of Onset   Hypertension Mother    Thyroid disease Mother    Cancer Mother    Alcoholism Mother    Cancer Father    Depression Father     Social History   Tobacco Use   Smoking status: Former    Packs/day: 1.00    Years: 5.00    Pack years: 5.00    Types: Cigarettes    Quit date: 06/06/1984    Years since  quitting: 36.9   Smokeless tobacco: Never  Substance Use Topics   Alcohol use: Yes    Alcohol/week: 1.0 standard drink    Types: 1 Standard drinks or equivalent per week    Comment: rarley   Marital Status: Married  ROS  Review of Systems  Constitutional: Positive for weight gain.  Cardiovascular:  Negative for chest pain, dyspnea on exertion and leg swelling.  Gastrointestinal:  Negative for melena.  Objective  Blood pressure (!) 142/72, pulse (!) 56, temperature (!) 97.5 F (36.4 C), temperature source Temporal, resp. rate 17, height 5\' 11"  (1.803 m), weight 247 lb 12.8 oz (112.4 kg), SpO2 97 %. Body mass index is 34.56 kg/m.  Vitals with BMI 05/10/2021 05/10/2021 02/07/2021  Height - 5\' 11"  5\' 11"   Weight - 247 lbs 13 oz 248 lbs  BMI - 34.58 34.6  Systolic 142 146 02/09/2021  Diastolic 72 73 78  Pulse 56 69 66     Physical Exam Constitutional:      Appearance: He is obese.  Neck:     Vascular: No carotid bruit or JVD.  Cardiovascular:     Rate and Rhythm: Normal rate and regular rhythm.     Pulses: Intact distal pulses.     Heart sounds: Normal heart sounds. No murmur heard.   No gallop.  Pulmonary:     Effort: Pulmonary effort is normal.     Breath sounds: Normal breath sounds.  Abdominal:     General: Bowel sounds are normal.     Palpations: Abdomen is soft.  Musculoskeletal:        General: No swelling.    Laboratory examination:   Recent Labs    04/10/21 0851  NA 139  K 4.9  CL 103  CO2 21  GLUCOSE 165*  BUN 14  CREATININE 1.11  CALCIUM 9.1   CrCl cannot be calculated (Patient's most recent lab result is older than the maximum 21 days allowed.).  CMP Latest Ref Rng & Units 04/10/2021 12/04/2017 09/24/2016  Glucose 65 - 99 mg/dL 629(B) 284(X) 324(M)  BUN 8 - 27 mg/dL 14 14 01(U)  Creatinine 0.76 - 1.27 mg/dL 2.72 5.36 6.44  Sodium 134 - 144 mmol/L 139 140 139  Potassium 3.5 - 5.2 mmol/L 4.9 4.2 4.6  Chloride 96 - 106 mmol/L 103 109 108  CO2 20 - 29 mmol/L  21 20(L) 24  Calcium 8.6 - 10.2 mg/dL 9.1 9.2 9.1  Total Protein 6.0 - 8.5 g/dL 6.6 - -  Total Bilirubin 0.0 - 1.2 mg/dL 0.7 - -  Alkaline Phos 44 - 121 IU/L 102 - -  AST 0 - 40 IU/L 45(H) - -  ALT 0 - 44 IU/L 30 - -   CBC Latest Ref Rng & Units 12/21/2020 12/04/2017 09/24/2016  WBC 3.4 - 10.8 x10E3/uL 6.4 6.5 9.1  Hemoglobin 13.0 - 17.7 g/dL 03.4 74.2 59.5  Hematocrit 37.5 - 51.0 % 44.4 42.8 39.4  Platelets 150 - 400 K/uL - 208 210    Lipid Panel Recent Labs    04/10/21 0851  CHOL 135  TRIG 115  LDLCALC 69  HDL 45   Lipid Panel     Component Value Date/Time   CHOL 135 04/10/2021 0851   TRIG 115 04/10/2021 0851   HDL 45 04/10/2021 0851   LDLCALC 69 04/10/2021 0851   LABVLDL 21 04/10/2021 0851     HEMOGLOBIN A1C No results found for: HGBA1C, MPG TSH Recent Labs    12/21/20 1001  TSH 2.370    External labs:   Labs 10/30/2020:  TSH 2.37.  A1c 6.5%, reduced from 8.7% previous to that.  Vitamin D 24.4.  Serum glucose 132 mg, BUN 11, creatinine 0.8, potassium 4.4, LFT mildly abnormal at AST 52 (7-45) and ALT 51 (5-40).  Total cholesterol 159, triglycerides 88, HDL 50, LDL 91.  Medications and allergies  No Known Allergies   Medication prior to this encounter:   Outpatient Medications Prior to Visit  Medication Sig Dispense Refill   cholecalciferol (VITAMIN D) 25 MCG (1000 UNIT) tablet Take 1,000 Units by mouth as needed.     metFORMIN (GLUCOPHAGE) 500 MG tablet Take by mouth 2 (two) times daily with a meal.     olmesartan (BENICAR) 20 MG tablet Take 1 tablet (20 mg total) by mouth every evening. 30 tablet 2   rosuvastatin (CRESTOR) 20 MG tablet Take 1 tablet (20 mg total) by mouth daily. 30 tablet 2   No facility-administered medications prior to visit.     FINAL MEDICATION AS OF TODAY:   Medications after current encounter Current Outpatient Medications  Medication Instructions   cholecalciferol (VITAMIN D) 1,000 Units, Oral, As needed   metFORMIN  (GLUCOPHAGE) 500 MG tablet Oral, 2 times daily with meals   olmesartan (BENICAR) 40 mg, Oral, Every evening   rosuvastatin (CRESTOR)  20 mg, Oral, Daily   Radiology:   No results found.  Cardiac Studies:   PCV ECHOCARDIOGRAM COMPLETE 02/22/2021 Normal LV systolic function with EF 65%. Left ventricle cavity is normal in size. Mild concentric hypertrophy of the left ventricle. Normal global wall motion. Normal diastolic filling pattern. Calculated EF 65%. Left atrial cavity is moderately dilated at 4.4 cm. Trileaflet aortic valve. No evidence of aortic stenosis. Mild (Grade I) aortic regurgitation. Structurally normal tricuspid valve. No evidence of tricuspid stenosis. Mild tricuspid regurgitation. Mild pulmonary hypertension. RVSP measures 34 mmHg.    CORONARY CALCIUM SCORES 03/19/2021: : Left Main: 10.5 LAD: 18.7 LCx: 117 RCA: 0 Total Agatston Score: 146. MESA database percentile: 26   AORTA MEASUREMENTS: Ascending Aorta: 40 mm. Descending Aorta: 26 mm EKG:   EKG 02/07/2021: Sinus bradycardia at rate of 59 bpm, otherwise normal EKG.       Assessment     ICD-10-CM   1. Hypercholesteremia  E78.00     2. Type 2 diabetes mellitus with hyperglycemia, without long-term current use of insulin (HCC)  E11.65 olmesartan (BENICAR) 40 MG tablet    3. Primary hypertension  I10 olmesartan (BENICAR) 40 MG tablet    rosuvastatin (CRESTOR) 20 MG tablet        Medications Discontinued During This Encounter  Medication Reason   rosuvastatin (CRESTOR) 20 MG tablet Reorder   olmesartan (BENICAR) 20 MG tablet Reorder     Meds ordered this encounter  Medications   olmesartan (BENICAR) 40 MG tablet    Sig: Take 1 tablet (40 mg total) by mouth every evening.    Dispense:  90 tablet    Refill:  3   rosuvastatin (CRESTOR) 20 MG tablet    Sig: Take 1 tablet (20 mg total) by mouth daily.    Dispense:  90 tablet    Refill:  3    No orders of the defined types were placed in this  encounter.  Recommendations:   [name] Wood is a 65 y.o. Caucasian male patient with diabetes mellitus, hyperlipidemia, fatty liver, obesity referred to me for cardiac risk stratification.  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  Reviewed the results of the echocardiogram and reassured him.  Minor valvular abnormalities of no clinical consequence at this point.  Unless he developed dyspnea or very loud murmur, no further evaluation is indicated.  With regard to coronary calcium score, he is at the 67th percentile and importance of LDL reduction to less than 100 and preferably closer to 70 was discussed with the patient in view of diabetes mellitus, he is presently at goal with Crestor.  Continue the same.  With regard to elevated blood pressure, I truly believe he has hypertension.  Will increase Benicar from 20 mg to 40 mg daily.  He is tolerating this well without any side effects.  His labs were reviewed, no change in LFTs and serum creatinine has remained stable and potassium levels are normal.  Unless he has new symptoms, continue primary prevention.  I will see him back on a as needed basis.   Yates Decamp, MD, [facility] 05/10/2021, 9:17 AM Office: [phone]

## 2022-02-08 ENCOUNTER — Other Ambulatory Visit (HOSPITAL_BASED_OUTPATIENT_CLINIC_OR_DEPARTMENT_OTHER): Payer: Self-pay

## 2022-02-08 MED ORDER — MOUNJARO 5 MG/0.5ML ~~LOC~~ SOAJ
SUBCUTANEOUS | 2 refills | Status: DC
  Filled 2022-02-08: qty 2, 28d supply, fill #0

## 2022-02-11 ENCOUNTER — Other Ambulatory Visit (HOSPITAL_COMMUNITY): Payer: Self-pay

## 2022-02-11 MED ORDER — MOUNJARO 5 MG/0.5ML ~~LOC~~ SOAJ
SUBCUTANEOUS | 2 refills | Status: DC
  Filled 2022-02-11: qty 6, 84d supply, fill #0

## 2022-03-04 ENCOUNTER — Other Ambulatory Visit (HOSPITAL_BASED_OUTPATIENT_CLINIC_OR_DEPARTMENT_OTHER): Payer: Self-pay

## 2022-03-04 MED ORDER — MOUNJARO 7.5 MG/0.5ML ~~LOC~~ SOAJ
7.5000 mg | SUBCUTANEOUS | 1 refills | Status: DC
  Filled 2022-03-04: qty 6, 84d supply, fill #0
  Filled 2023-01-15: qty 2, 28d supply, fill #1

## 2022-04-03 ENCOUNTER — Encounter (INDEPENDENT_AMBULATORY_CARE_PROVIDER_SITE_OTHER): Payer: Self-pay

## 2022-04-30 ENCOUNTER — Other Ambulatory Visit (HOSPITAL_BASED_OUTPATIENT_CLINIC_OR_DEPARTMENT_OTHER): Payer: Self-pay

## 2022-04-30 ENCOUNTER — Other Ambulatory Visit: Payer: Self-pay | Admitting: Cardiology

## 2022-04-30 DIAGNOSIS — I10 Essential (primary) hypertension: Secondary | ICD-10-CM

## 2022-04-30 DIAGNOSIS — E1165 Type 2 diabetes mellitus with hyperglycemia: Secondary | ICD-10-CM

## 2022-05-03 ENCOUNTER — Other Ambulatory Visit: Payer: Self-pay | Admitting: Cardiology

## 2022-05-03 DIAGNOSIS — I10 Essential (primary) hypertension: Secondary | ICD-10-CM

## 2022-06-12 ENCOUNTER — Other Ambulatory Visit (HOSPITAL_BASED_OUTPATIENT_CLINIC_OR_DEPARTMENT_OTHER): Payer: Self-pay

## 2022-06-12 MED ORDER — COMIRNATY 30 MCG/0.3ML IM SUSY
PREFILLED_SYRINGE | INTRAMUSCULAR | 0 refills | Status: DC
  Filled 2022-06-12: qty 0.3, 1d supply, fill #0

## 2022-06-12 MED ORDER — INFLUENZA VAC SPLIT QUAD 0.5 ML IM SUSY
PREFILLED_SYRINGE | INTRAMUSCULAR | 0 refills | Status: DC
  Filled 2022-06-12: qty 0.5, 1d supply, fill #0

## 2022-07-09 ENCOUNTER — Other Ambulatory Visit (HOSPITAL_BASED_OUTPATIENT_CLINIC_OR_DEPARTMENT_OTHER): Payer: Self-pay

## 2022-07-09 MED ORDER — MOUNJARO 2.5 MG/0.5ML ~~LOC~~ SOAJ
2.5000 mg | SUBCUTANEOUS | 0 refills | Status: DC
  Filled 2022-07-09: qty 6, 84d supply, fill #0
  Filled 2022-07-09: qty 2, 28d supply, fill #0

## 2022-08-20 ENCOUNTER — Other Ambulatory Visit (HOSPITAL_BASED_OUTPATIENT_CLINIC_OR_DEPARTMENT_OTHER): Payer: Self-pay

## 2022-08-20 MED ORDER — MOUNJARO 2.5 MG/0.5ML ~~LOC~~ SOAJ
2.5000 mg | SUBCUTANEOUS | 0 refills | Status: DC
  Filled 2022-08-20: qty 2, 28d supply, fill #0

## 2022-09-05 ENCOUNTER — Other Ambulatory Visit (HOSPITAL_BASED_OUTPATIENT_CLINIC_OR_DEPARTMENT_OTHER): Payer: Self-pay

## 2022-09-05 MED ORDER — MOUNJARO 2.5 MG/0.5ML ~~LOC~~ SOAJ
2.5000 mg | SUBCUTANEOUS | 0 refills | Status: AC
  Filled 2022-09-05: qty 2, 28d supply, fill #0

## 2022-10-15 ENCOUNTER — Other Ambulatory Visit (HOSPITAL_BASED_OUTPATIENT_CLINIC_OR_DEPARTMENT_OTHER): Payer: Self-pay

## 2022-10-15 MED ORDER — MOUNJARO 5 MG/0.5ML ~~LOC~~ SOAJ
SUBCUTANEOUS | 0 refills | Status: DC
  Filled 2022-10-15: qty 2, 28d supply, fill #0

## 2022-10-15 MED ORDER — MOUNJARO 5 MG/0.5ML ~~LOC~~ SOAJ: 5.0000 mg | SUBCUTANEOUS | 0 refills | Status: DC

## 2022-10-22 ENCOUNTER — Other Ambulatory Visit (HOSPITAL_BASED_OUTPATIENT_CLINIC_OR_DEPARTMENT_OTHER): Payer: Self-pay

## 2022-10-24 ENCOUNTER — Other Ambulatory Visit: Payer: Self-pay

## 2022-10-24 ENCOUNTER — Other Ambulatory Visit (HOSPITAL_BASED_OUTPATIENT_CLINIC_OR_DEPARTMENT_OTHER): Payer: Self-pay

## 2022-10-24 MED ORDER — OZEMPIC (0.25 OR 0.5 MG/DOSE) 2 MG/3ML ~~LOC~~ SOPN
0.5000 mg | PEN_INJECTOR | SUBCUTANEOUS | 0 refills | Status: DC
  Filled 2022-10-24: qty 3, 28d supply, fill #0

## 2022-10-24 MED ORDER — MOUNJARO 7.5 MG/0.5ML ~~LOC~~ SOAJ
7.5000 mg | SUBCUTANEOUS | 1 refills | Status: DC
  Filled 2022-10-24: qty 6, 84d supply, fill #0
  Filled 2023-04-29: qty 6, 84d supply, fill #1

## 2022-10-24 MED FILL — Rosuvastatin Calcium Tab 20 MG: ORAL | 90 days supply | Qty: 90 | Fill #0 | Status: AC

## 2022-10-24 MED FILL — Olmesartan Medoxomil Tab 40 MG: ORAL | 90 days supply | Qty: 90 | Fill #0 | Status: AC

## 2022-10-25 ENCOUNTER — Other Ambulatory Visit: Payer: Self-pay

## 2022-10-25 ENCOUNTER — Other Ambulatory Visit (HOSPITAL_BASED_OUTPATIENT_CLINIC_OR_DEPARTMENT_OTHER): Payer: Self-pay

## 2022-10-26 ENCOUNTER — Other Ambulatory Visit (HOSPITAL_BASED_OUTPATIENT_CLINIC_OR_DEPARTMENT_OTHER): Payer: Self-pay

## 2022-11-05 IMAGING — CT CT CARDIAC CORONARY ARTERY CALCIUM SCORE
3 series · 14 of 20 positions shown, 16 images · non-contrast
Comparison: None.

CLINICAL DATA: Hyperlipidemia

EXAM:
CT CARDIAC CORONARY ARTERY CALCIUM SCORE
TECHNIQUE: Non-contrast imaging through the heart was performed using
prospective ECG gating. Image post processing was performed on an
independent workstation, allowing for quantitative analysis of the
heart and coronary arteries. Note that this exam targets the heart
and the chest was not imaged in its entirety.

[Series 2: calcium scoring 2.00 qr36 bestdiast 66% hrt calciu · axial · 0.43mm/px · z∈[+1707,+1779]mm · 4 of 60 slices shown]
[im 12/60  vessel]
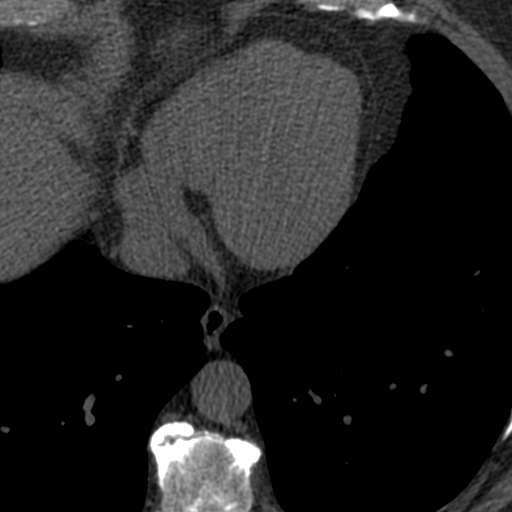
[im 24/60  vessel]
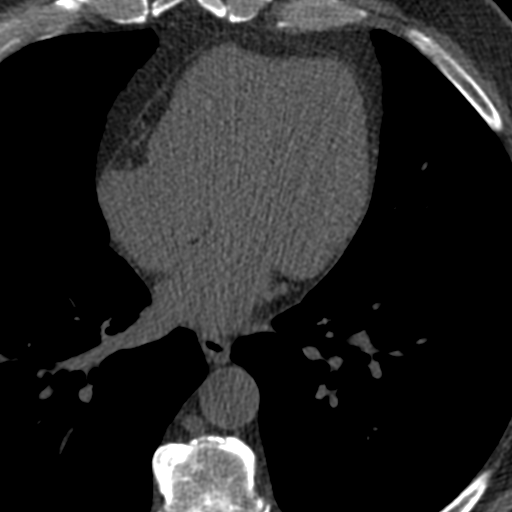
[im 36/60  vessel]
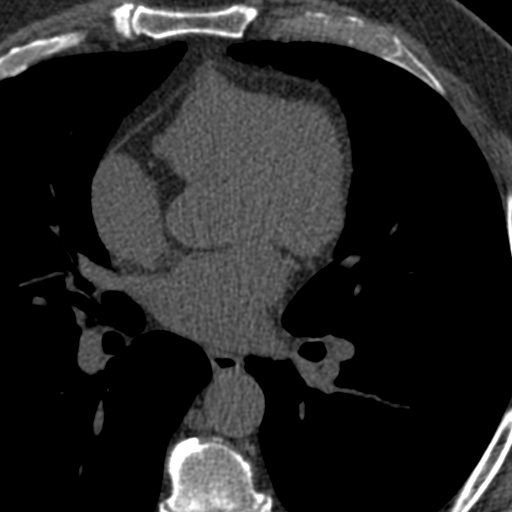
[im 48/60  vessel]
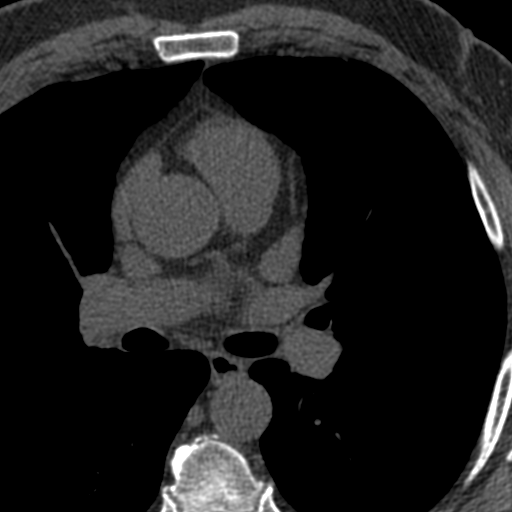

[Series 3: calcium scoring 2.00 br40 bestdiast 66% axial · axial · 0.67mm/px · z∈[+1703,+1783]mm · 5 of 60 slices shown, 7 images]
[im 10/60  vessel]
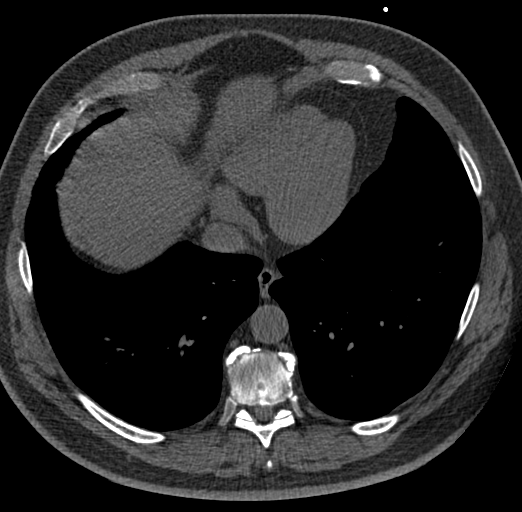
[im 10/60  lung]
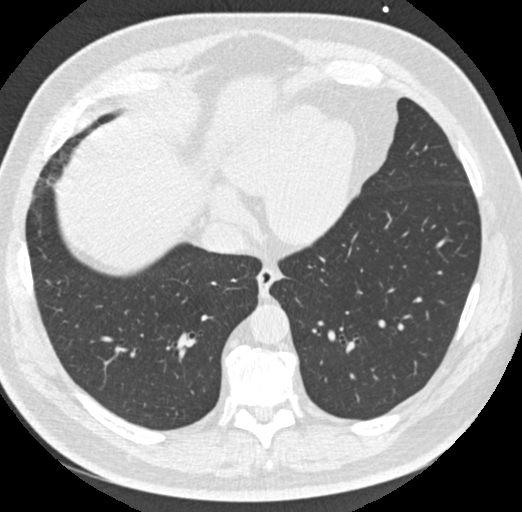
[im 20/60  vessel]
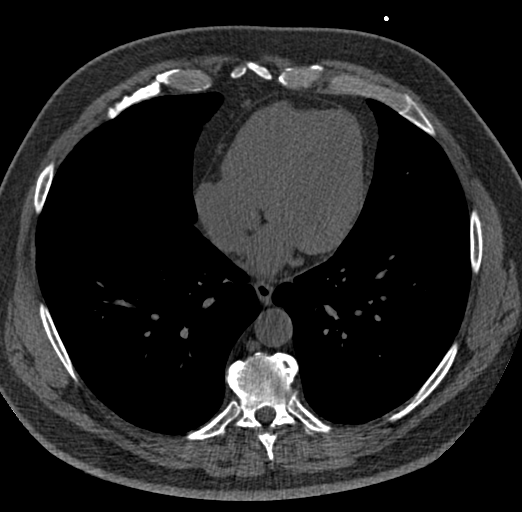
[im 30/60  vessel]
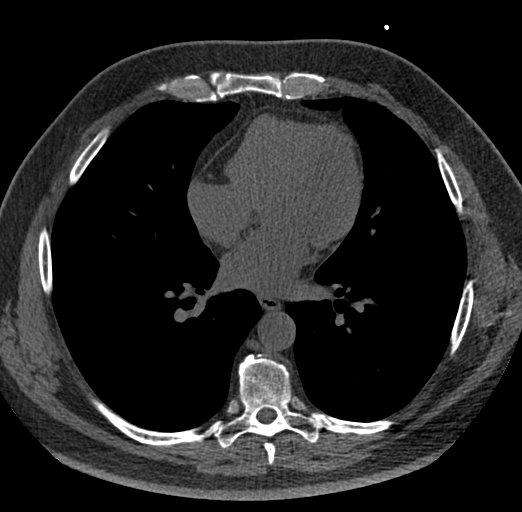
[im 40/60  vessel]
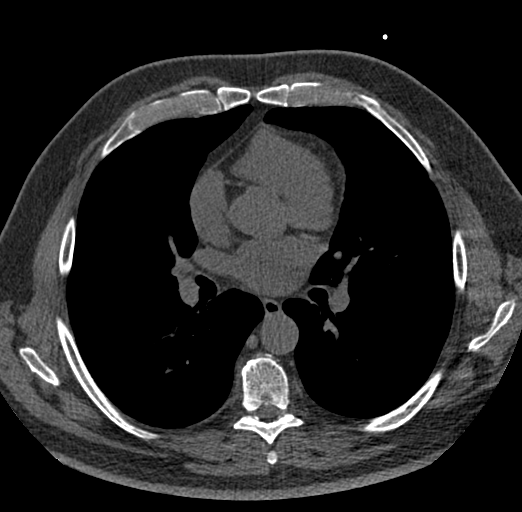
[im 50/60  vessel]
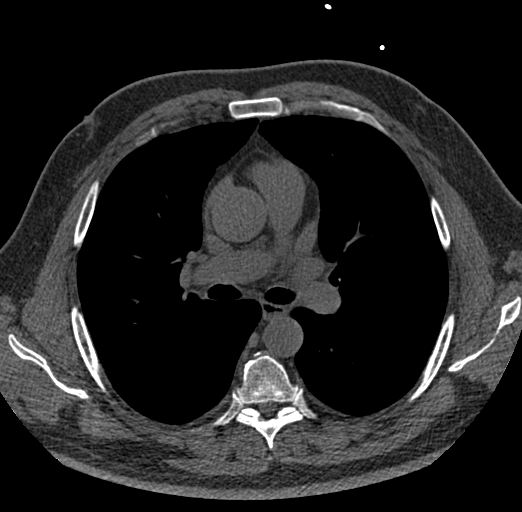
[im 50/60  lung]
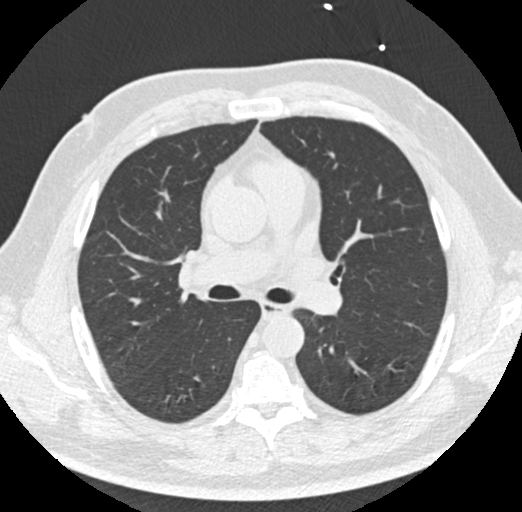

[Series 9: calcium scoring 2.00 br60 bestdiast 66% lungs · axial · 0.70mm/px · z∈[+1703,+1783]mm · 5 of 60 slices shown]
[im 10/60  vessel]
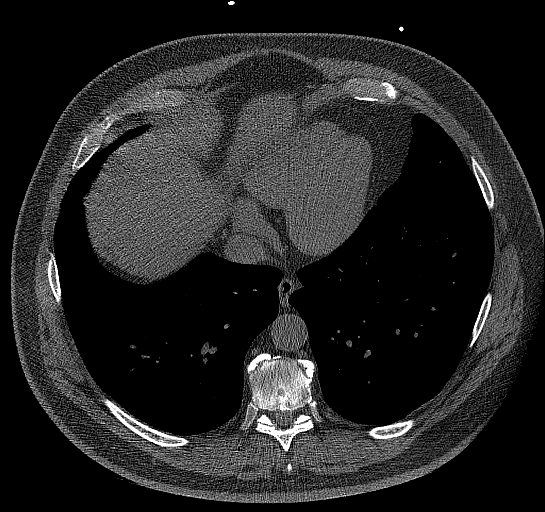
[im 20/60  vessel]
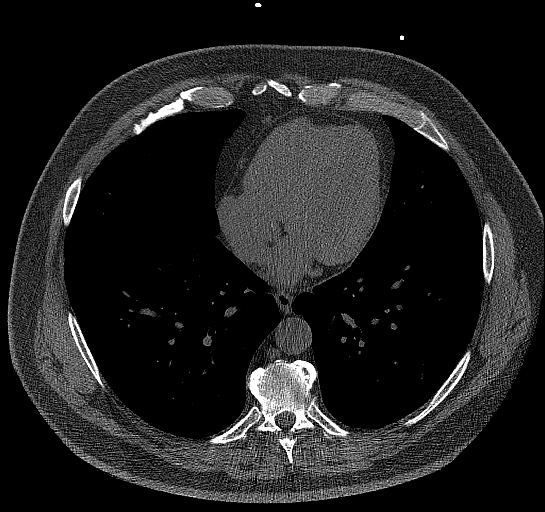
[im 30/60  vessel]
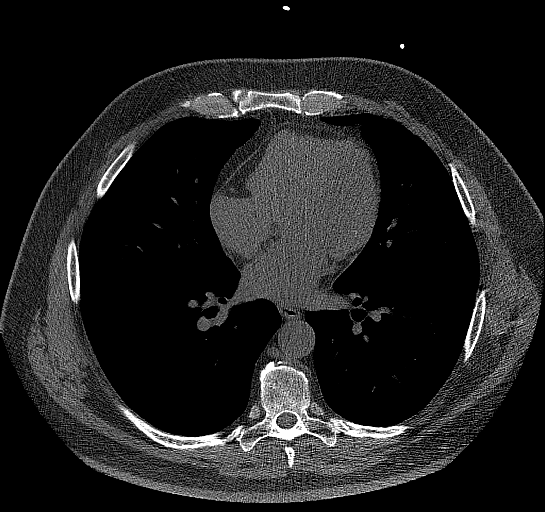
[im 40/60  vessel]
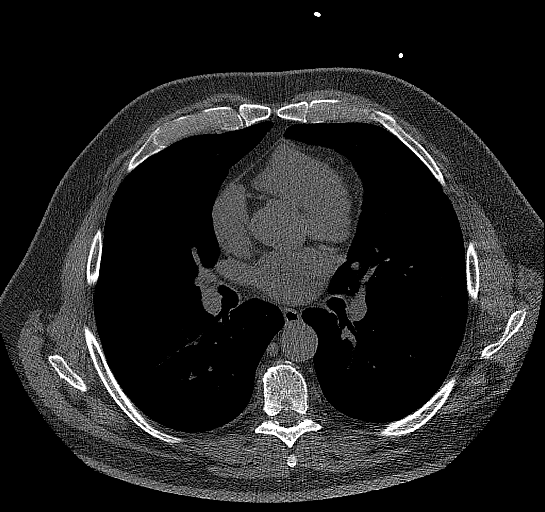
[im 50/60  vessel]
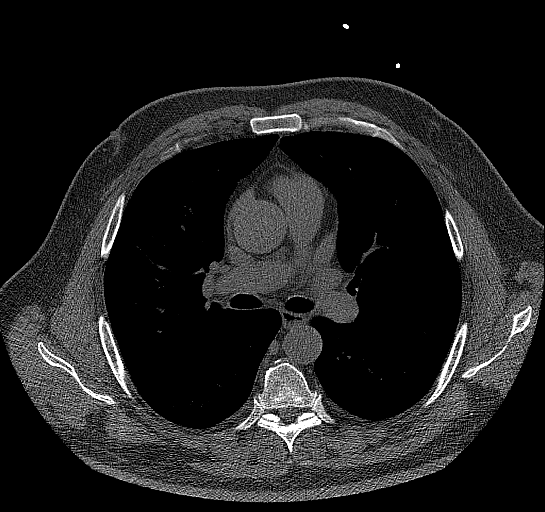

[14 of 20 positions shown; findings below may reference images not displayed]

FINDINGS: CORONARY CALCIUM SCORES:

Left Main:

LAD:

LCx: 117

RCA: 0

Total Agatston Score: 146

[HOSPITAL] percentile: 67

AORTA MEASUREMENTS:

Ascending Aorta: 40 mm

Descending Aorta: 26 mm

OTHER FINDINGS:

Heart is normal size. Slight aneurysmal dilatation of the ascending
thoracic aorta measuring up to 4 cm. No adenopathy. Visualized lungs
clear. No effusions. Imaging into the upper abdomen demonstrates no
acute findings. Chest wall soft tissues are unremarkable. No acute
bony abnormality.
IMPRESSION: Total Agatston score: One hundred forty-six

[HOSPITAL] percentile: 67

4 cm ascending thoracic aortic aneurysm. Recommend annual imaging
followup by CTA or MRA. This recommendation follows 8959
ACCF/AHA/AATS/ACR/ASA/SCA/SOLTAN/SASAKI/TERCIUS/NOMASIBULELE Guidelines for the
Diagnosis and Management of Patients with Thoracic Aortic Disease.
Circulation. 8959; 121: E266-e369. Aortic aneurysm NOS ([id number].5)

## 2022-12-25 IMAGING — US US RENAL
1 series · 14 of 25 positions shown · non-contrast
Comparison: Ultrasound 12/15/2015.

CLINICAL DATA: Family history of renal cancer.

EXAM:
RENAL / URINARY TRACT ULTRASOUND COMPLETE

[Series 1: us renal · 0.25mm/px · 14 of 37 slices shown]
[im 1/37]
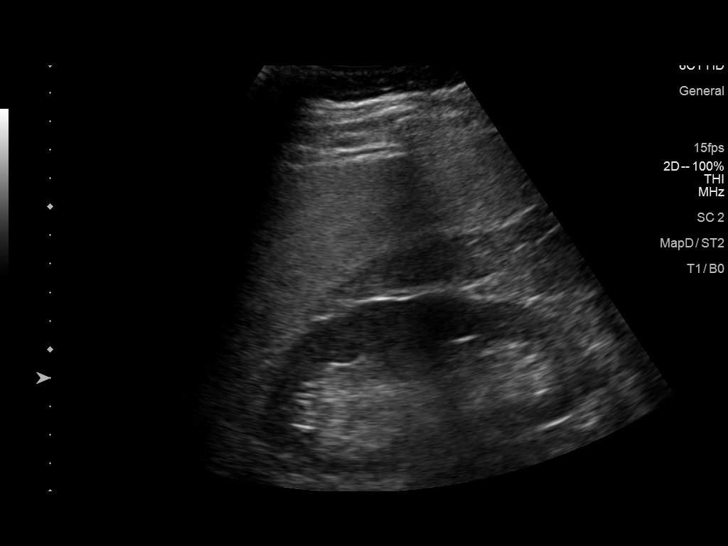
[im 4/37]
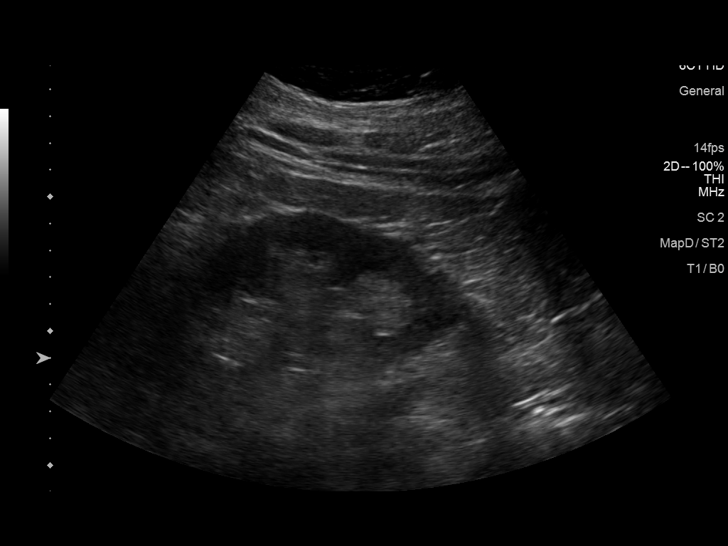
[im 7/37]
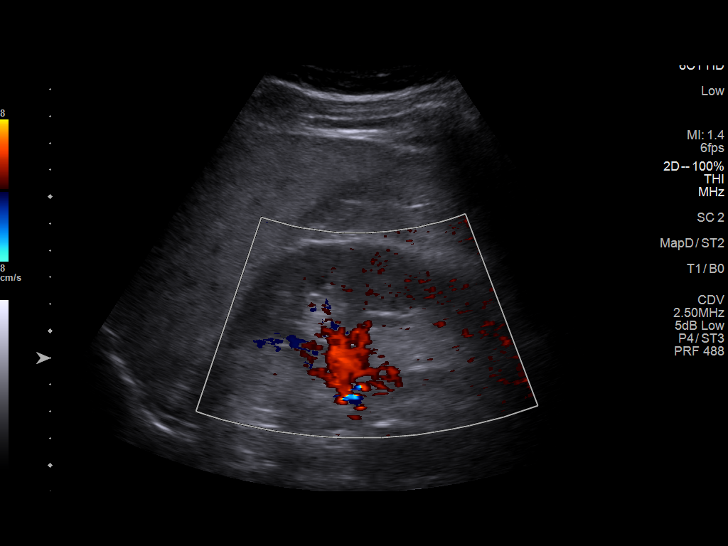
[im 10/37]
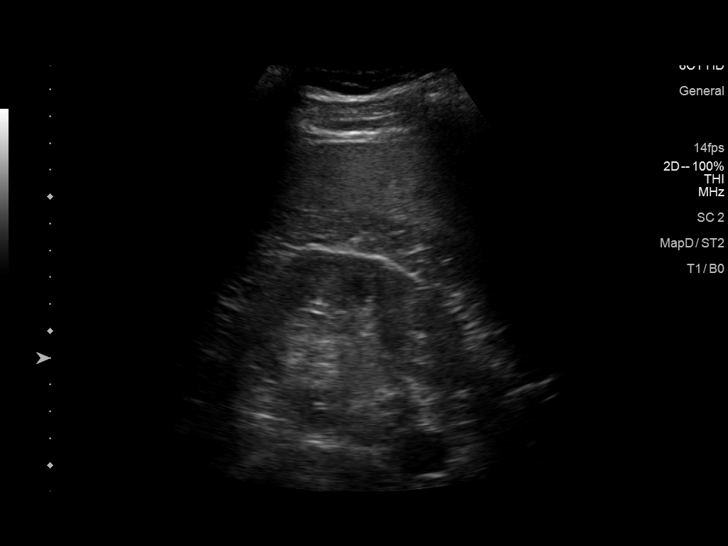
[im 13/37]
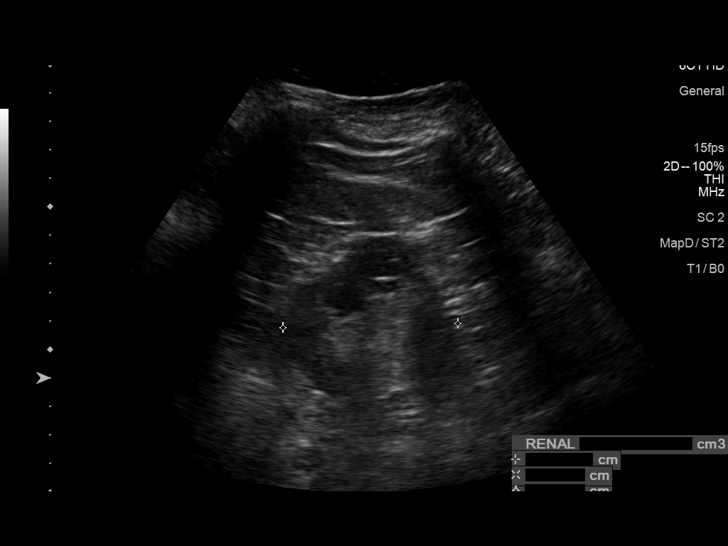
[im 14/37]
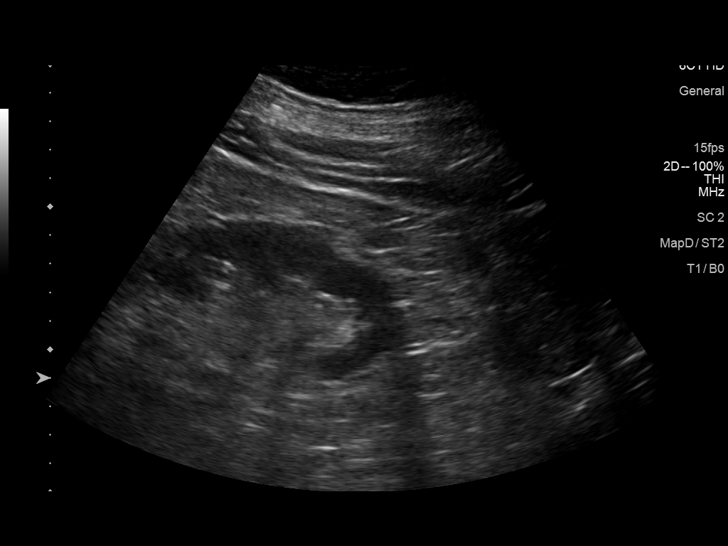
[im 17/37]
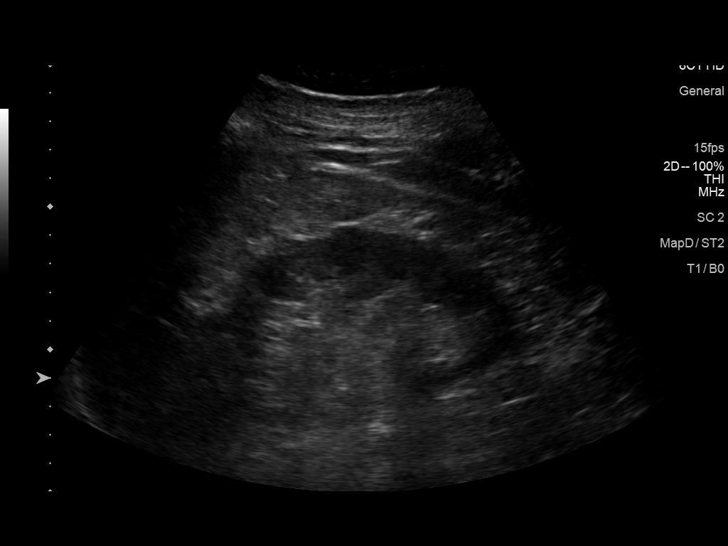
[im 20/37]
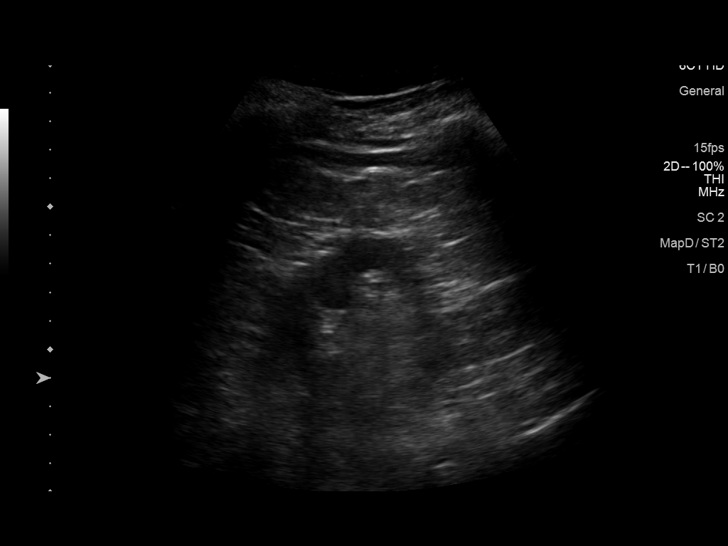
[im 23/37]
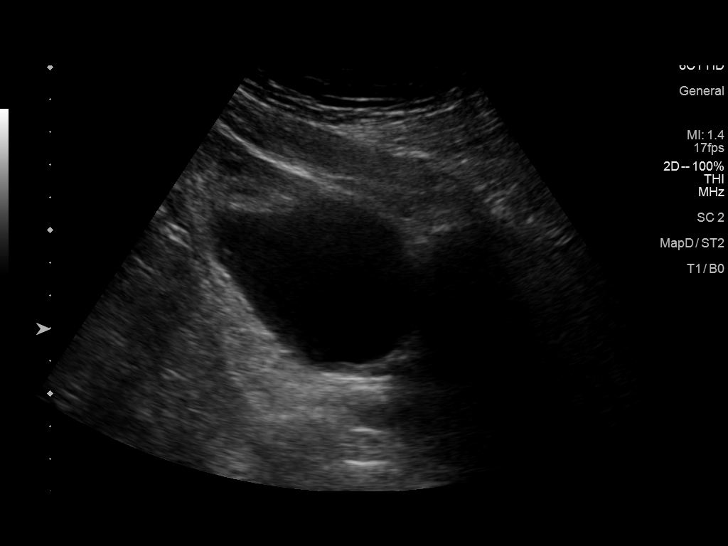
[im 25/37]
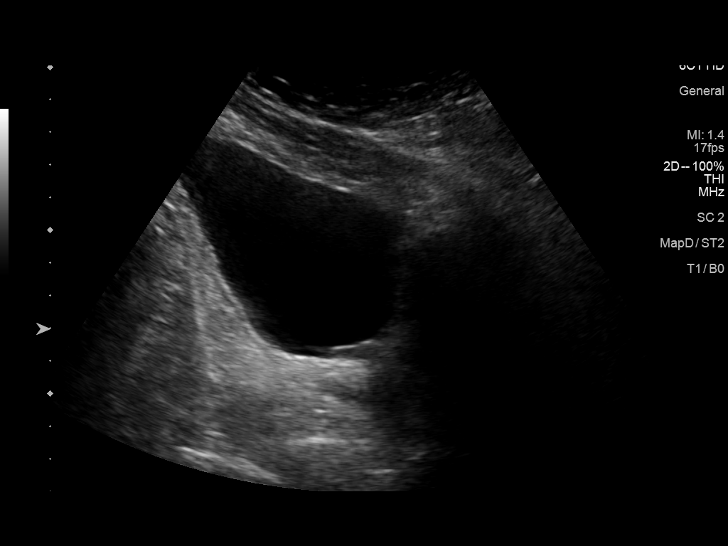
[im 28/37]
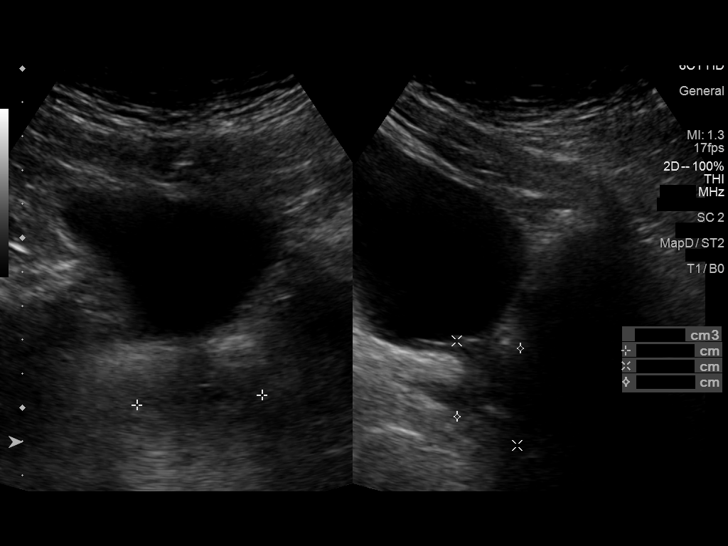
[im 31/37]
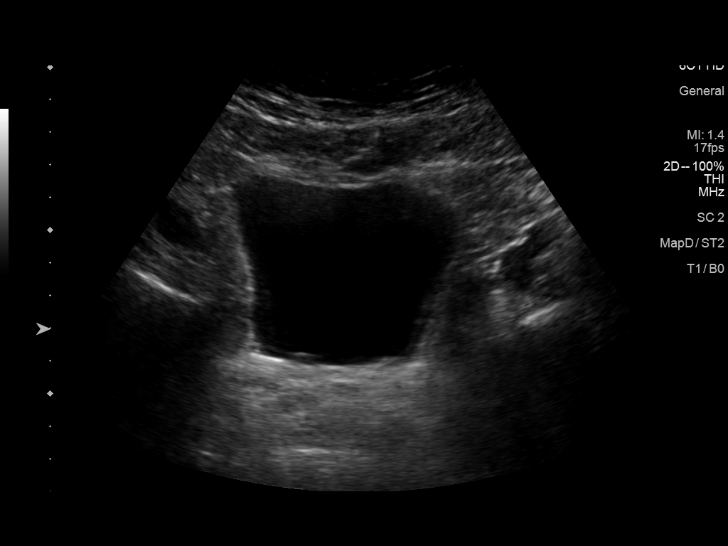
[im 34/37]
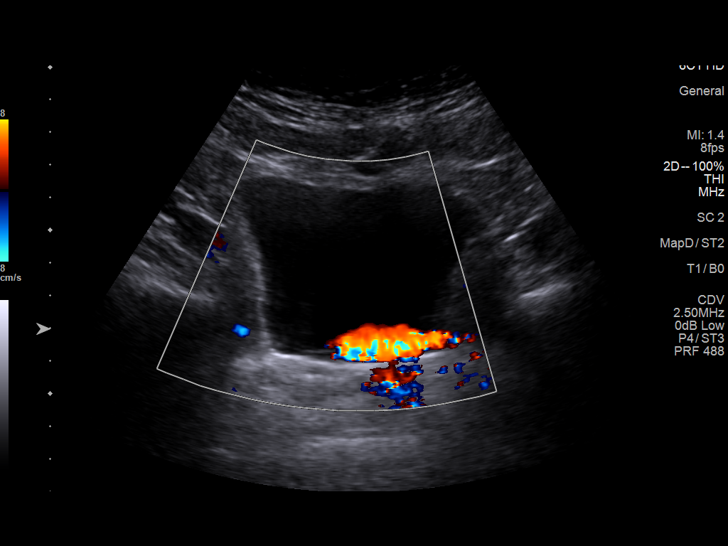
[im 37/37]
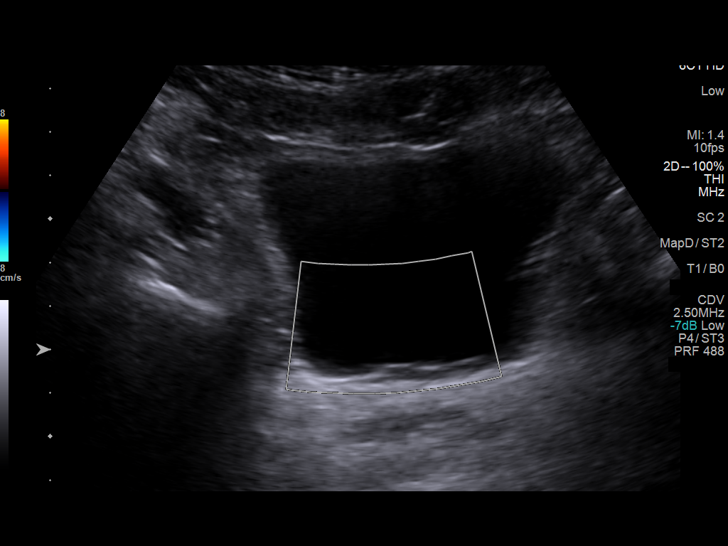

[14 of 25 positions shown; findings below may reference images not displayed]

FINDINGS: Right Kidney:

Renal measurements: 13.5 x 6.6 x 6.4 cm = volume: 301 mL.
Echogenicity within normal limits. No mass or hydronephrosis
visualized.

Left Kidney:

Renal measurements: 13.6 x 7.3 x 6.1 cm = volume: 317 mL.
Echogenicity within normal limits. No mass or hydronephrosis
visualized.

Bladder:

Prostate measures 3.7 x 3.6 x 2.7 cm with a volume of 18.9 mL.
Question small amount of debris noted in the bladder. Bladder is
nondistended. Bilateral ureteral jets noted.

Other:

None.
IMPRESSION: 1.  No focal renal abnormality identified.

2.  Questionable small amount of debris noted in the bladder.

## 2023-01-15 ENCOUNTER — Other Ambulatory Visit (HOSPITAL_BASED_OUTPATIENT_CLINIC_OR_DEPARTMENT_OTHER): Payer: Self-pay

## 2023-01-23 MED FILL — Olmesartan Medoxomil Tab 40 MG: ORAL | 90 days supply | Qty: 90 | Fill #1 | Status: AC

## 2023-01-23 MED FILL — Rosuvastatin Calcium Tab 20 MG: ORAL | 90 days supply | Qty: 90 | Fill #1 | Status: AC

## 2023-02-18 ENCOUNTER — Other Ambulatory Visit (HOSPITAL_BASED_OUTPATIENT_CLINIC_OR_DEPARTMENT_OTHER): Payer: Self-pay

## 2023-02-18 MED ORDER — MOUNJARO 10 MG/0.5ML ~~LOC~~ SOAJ
10.0000 mg | SUBCUTANEOUS | 0 refills | Status: DC
  Filled 2023-02-18: qty 2, 28d supply, fill #0
  Filled 2023-03-15 (×2): qty 2, 28d supply, fill #1
  Filled 2023-04-11: qty 2, 28d supply, fill #2

## 2023-03-13 ENCOUNTER — Other Ambulatory Visit: Payer: Self-pay | Admitting: Family Medicine

## 2023-03-13 DIAGNOSIS — Z8051 Family history of malignant neoplasm of kidney: Secondary | ICD-10-CM

## 2023-03-14 ENCOUNTER — Other Ambulatory Visit (HOSPITAL_BASED_OUTPATIENT_CLINIC_OR_DEPARTMENT_OTHER): Payer: Self-pay

## 2023-03-15 ENCOUNTER — Other Ambulatory Visit (HOSPITAL_BASED_OUTPATIENT_CLINIC_OR_DEPARTMENT_OTHER): Payer: Self-pay

## 2023-03-24 ENCOUNTER — Ambulatory Visit
Admission: RE | Admit: 2023-03-24 | Discharge: 2023-03-24 | Disposition: A | Discharge status: Home / Self Care | Payer: 59 | Source: Ambulatory Visit | Attending: Family Medicine | Admitting: Family Medicine

## 2023-03-24 DIAGNOSIS — Z8051 Family history of malignant neoplasm of kidney: Secondary | ICD-10-CM

## 2023-04-11 ENCOUNTER — Other Ambulatory Visit (HOSPITAL_BASED_OUTPATIENT_CLINIC_OR_DEPARTMENT_OTHER): Payer: Self-pay

## 2023-04-20 ENCOUNTER — Other Ambulatory Visit: Payer: Self-pay | Admitting: Cardiology

## 2023-04-20 DIAGNOSIS — I10 Essential (primary) hypertension: Secondary | ICD-10-CM

## 2023-04-20 DIAGNOSIS — E1165 Type 2 diabetes mellitus with hyperglycemia: Secondary | ICD-10-CM

## 2023-04-29 ENCOUNTER — Other Ambulatory Visit: Payer: Self-pay

## 2023-04-29 ENCOUNTER — Other Ambulatory Visit: Payer: Self-pay | Admitting: Cardiology

## 2023-04-29 DIAGNOSIS — E1165 Type 2 diabetes mellitus with hyperglycemia: Secondary | ICD-10-CM

## 2023-04-29 DIAGNOSIS — I10 Essential (primary) hypertension: Secondary | ICD-10-CM

## 2023-04-30 ENCOUNTER — Other Ambulatory Visit: Payer: Self-pay

## 2023-05-01 ENCOUNTER — Other Ambulatory Visit: Payer: Self-pay | Admitting: Cardiology

## 2023-05-01 DIAGNOSIS — E1165 Type 2 diabetes mellitus with hyperglycemia: Secondary | ICD-10-CM

## 2023-05-01 DIAGNOSIS — I10 Essential (primary) hypertension: Secondary | ICD-10-CM

## 2023-05-05 ENCOUNTER — Other Ambulatory Visit (HOSPITAL_BASED_OUTPATIENT_CLINIC_OR_DEPARTMENT_OTHER): Payer: Self-pay

## 2023-05-06 ENCOUNTER — Other Ambulatory Visit (HOSPITAL_BASED_OUTPATIENT_CLINIC_OR_DEPARTMENT_OTHER): Payer: Self-pay

## 2023-05-06 MED ORDER — ROSUVASTATIN CALCIUM 20 MG PO TABS
20.0000 mg | ORAL_TABLET | Freq: Every day | ORAL | 1 refills | Status: DC
  Filled 2023-05-06: qty 90, 90d supply, fill #0
  Filled 2023-07-14: qty 90, 90d supply, fill #1

## 2023-05-06 MED ORDER — OLMESARTAN MEDOXOMIL 20 MG PO TABS
20.0000 mg | ORAL_TABLET | Freq: Every day | ORAL | 1 refills | Status: DC
  Filled 2023-05-06: qty 90, 90d supply, fill #0
  Filled 2023-07-14: qty 90, 90d supply, fill #1

## 2023-07-14 ENCOUNTER — Other Ambulatory Visit (HOSPITAL_BASED_OUTPATIENT_CLINIC_OR_DEPARTMENT_OTHER): Payer: Self-pay

## 2023-07-14 ENCOUNTER — Other Ambulatory Visit: Payer: Self-pay | Admitting: Cardiology

## 2023-07-14 DIAGNOSIS — I10 Essential (primary) hypertension: Secondary | ICD-10-CM

## 2023-07-14 DIAGNOSIS — E1165 Type 2 diabetes mellitus with hyperglycemia: Secondary | ICD-10-CM

## 2023-07-14 MED ORDER — MOUNJARO 10 MG/0.5ML ~~LOC~~ SOAJ
10.0000 mg | SUBCUTANEOUS | 0 refills | Status: DC
  Filled 2023-07-14: qty 2, 28d supply, fill #0

## 2023-07-17 ENCOUNTER — Other Ambulatory Visit: Payer: Self-pay

## 2023-07-17 ENCOUNTER — Other Ambulatory Visit (HOSPITAL_BASED_OUTPATIENT_CLINIC_OR_DEPARTMENT_OTHER): Payer: Self-pay

## 2023-07-17 MED ORDER — OLMESARTAN MEDOXOMIL 40 MG PO TABS
40.0000 mg | ORAL_TABLET | Freq: Every evening | ORAL | 0 refills | Status: DC
  Filled 2023-07-17: qty 30, 30d supply, fill #0

## 2023-07-17 MED ORDER — ROSUVASTATIN CALCIUM 20 MG PO TABS
20.0000 mg | ORAL_TABLET | Freq: Every day | ORAL | 0 refills | Status: DC
  Filled 2023-07-17 – 2024-06-02 (×3): qty 30, 30d supply, fill #0

## 2023-08-21 LAB — HM DIABETES EYE EXAM

## 2023-08-29 ENCOUNTER — Other Ambulatory Visit (HOSPITAL_BASED_OUTPATIENT_CLINIC_OR_DEPARTMENT_OTHER): Payer: Self-pay

## 2023-08-29 MED ORDER — MOUNJARO 10 MG/0.5ML ~~LOC~~ SOAJ
10.0000 mg | SUBCUTANEOUS | 0 refills | Status: DC
  Filled 2023-08-29: qty 2, 28d supply, fill #0

## 2023-09-09 LAB — HEMOGLOBIN A1C: Hemoglobin A1C: 6.6

## 2023-09-27 ENCOUNTER — Other Ambulatory Visit (HOSPITAL_BASED_OUTPATIENT_CLINIC_OR_DEPARTMENT_OTHER): Payer: Self-pay

## 2023-09-27 MED ORDER — VALACYCLOVIR HCL 1 G PO TABS
ORAL_TABLET | ORAL | 0 refills | Status: DC
  Filled 2023-09-27: qty 21, 7d supply, fill #0

## 2023-09-27 MED ORDER — TRIAMCINOLONE ACETONIDE 0.1 % EX OINT
TOPICAL_OINTMENT | CUTANEOUS | 0 refills | Status: DC
  Filled 2023-09-27: qty 30, 30d supply, fill #0

## 2023-10-03 ENCOUNTER — Other Ambulatory Visit (HOSPITAL_BASED_OUTPATIENT_CLINIC_OR_DEPARTMENT_OTHER): Payer: Self-pay

## 2023-10-03 MED ORDER — VALACYCLOVIR HCL 1 G PO TABS
1000.0000 mg | ORAL_TABLET | Freq: Three times a day (TID) | ORAL | 0 refills | Status: DC
  Filled 2023-10-03: qty 21, 7d supply, fill #0

## 2023-10-14 ENCOUNTER — Other Ambulatory Visit (HOSPITAL_BASED_OUTPATIENT_CLINIC_OR_DEPARTMENT_OTHER): Payer: Self-pay

## 2023-10-26 ENCOUNTER — Other Ambulatory Visit (HOSPITAL_BASED_OUTPATIENT_CLINIC_OR_DEPARTMENT_OTHER): Payer: Self-pay

## 2023-10-27 ENCOUNTER — Other Ambulatory Visit (HOSPITAL_BASED_OUTPATIENT_CLINIC_OR_DEPARTMENT_OTHER): Payer: Self-pay

## 2023-10-27 MED ORDER — MOUNJARO 10 MG/0.5ML ~~LOC~~ SOAJ
10.0000 mg | SUBCUTANEOUS | 1 refills | Status: DC
  Filled 2023-10-27: qty 2, 28d supply, fill #0
  Filled 2023-11-20: qty 2, 28d supply, fill #1

## 2023-10-27 MED ORDER — OLMESARTAN MEDOXOMIL 20 MG PO TABS
20.0000 mg | ORAL_TABLET | Freq: Every day | ORAL | 1 refills | Status: DC
  Filled 2023-10-27: qty 90, 90d supply, fill #0
  Filled 2024-01-25: qty 90, 90d supply, fill #1
  Filled 2024-04-12: qty 20, 20d supply, fill #2

## 2023-10-27 MED ORDER — ROSUVASTATIN CALCIUM 20 MG PO TABS
20.0000 mg | ORAL_TABLET | Freq: Every day | ORAL | 1 refills | Status: DC
  Filled 2023-10-27: qty 90, 90d supply, fill #0
  Filled 2024-01-25: qty 90, 90d supply, fill #1
  Filled 2024-04-12: qty 20, 20d supply, fill #2

## 2023-11-08 ENCOUNTER — Other Ambulatory Visit (HOSPITAL_BASED_OUTPATIENT_CLINIC_OR_DEPARTMENT_OTHER): Payer: Self-pay

## 2024-01-25 ENCOUNTER — Other Ambulatory Visit (HOSPITAL_BASED_OUTPATIENT_CLINIC_OR_DEPARTMENT_OTHER): Payer: Self-pay

## 2024-01-26 ENCOUNTER — Other Ambulatory Visit: Payer: Self-pay

## 2024-01-26 ENCOUNTER — Other Ambulatory Visit (HOSPITAL_BASED_OUTPATIENT_CLINIC_OR_DEPARTMENT_OTHER): Payer: Self-pay

## 2024-01-26 MED ORDER — MOUNJARO 10 MG/0.5ML ~~LOC~~ SOAJ
10.0000 mg | SUBCUTANEOUS | 1 refills | Status: DC
  Filled 2024-01-26: qty 2, 28d supply, fill #0
  Filled 2024-04-12: qty 2, 28d supply, fill #1

## 2024-04-12 ENCOUNTER — Other Ambulatory Visit (HOSPITAL_BASED_OUTPATIENT_CLINIC_OR_DEPARTMENT_OTHER): Payer: Self-pay

## 2024-04-12 ENCOUNTER — Other Ambulatory Visit: Payer: Self-pay

## 2024-05-18 ENCOUNTER — Other Ambulatory Visit (HOSPITAL_BASED_OUTPATIENT_CLINIC_OR_DEPARTMENT_OTHER): Payer: Self-pay

## 2024-05-18 MED ORDER — MOUNJARO 10 MG/0.5ML ~~LOC~~ SOAJ
10.0000 mg | SUBCUTANEOUS | 1 refills | Status: DC
  Filled 2024-05-18: qty 2, 28d supply, fill #0
  Filled 2024-06-10: qty 2, 28d supply, fill #1

## 2024-05-31 ENCOUNTER — Other Ambulatory Visit (HOSPITAL_BASED_OUTPATIENT_CLINIC_OR_DEPARTMENT_OTHER): Payer: Self-pay

## 2024-05-31 MED ORDER — ROSUVASTATIN CALCIUM 20 MG PO TABS
20.0000 mg | ORAL_TABLET | Freq: Every day | ORAL | 1 refills | Status: DC
  Filled 2024-05-31: qty 90, 90d supply, fill #0
  Filled 2024-06-02: qty 30, 30d supply, fill #0

## 2024-06-02 ENCOUNTER — Other Ambulatory Visit (HOSPITAL_COMMUNITY): Payer: Self-pay

## 2024-06-02 ENCOUNTER — Other Ambulatory Visit (HOSPITAL_BASED_OUTPATIENT_CLINIC_OR_DEPARTMENT_OTHER): Payer: Self-pay

## 2024-06-02 MED ORDER — OLMESARTAN MEDOXOMIL 20 MG PO TABS
20.0000 mg | ORAL_TABLET | Freq: Every day | ORAL | 1 refills | Status: DC
  Filled 2024-06-02 – 2024-07-31 (×2): qty 90, 90d supply, fill #0

## 2024-07-31 ENCOUNTER — Other Ambulatory Visit (HOSPITAL_BASED_OUTPATIENT_CLINIC_OR_DEPARTMENT_OTHER): Payer: Self-pay

## 2024-08-01 ENCOUNTER — Other Ambulatory Visit: Payer: Self-pay

## 2024-08-02 ENCOUNTER — Other Ambulatory Visit (HOSPITAL_BASED_OUTPATIENT_CLINIC_OR_DEPARTMENT_OTHER): Payer: Self-pay

## 2024-08-02 MED ORDER — MOUNJARO 10 MG/0.5ML ~~LOC~~ SOAJ
10.0000 mg | SUBCUTANEOUS | 1 refills | Status: DC
  Filled 2024-08-02: qty 2, 28d supply, fill #0

## 2024-08-02 MED ORDER — ROSUVASTATIN CALCIUM 20 MG PO TABS
20.0000 mg | ORAL_TABLET | Freq: Every day | ORAL | 1 refills | Status: DC
  Filled 2024-08-02: qty 90, 90d supply, fill #0

## 2024-08-03 ENCOUNTER — Ambulatory Visit: Admitting: Family Medicine

## 2024-08-04 ENCOUNTER — Ambulatory Visit: Admitting: Family Medicine

## 2024-08-04 ENCOUNTER — Other Ambulatory Visit (HOSPITAL_BASED_OUTPATIENT_CLINIC_OR_DEPARTMENT_OTHER): Payer: Self-pay

## 2024-08-04 ENCOUNTER — Encounter: Payer: Self-pay | Admitting: Family Medicine

## 2024-08-04 VITALS — BP 112/58 | HR 61 | Ht 71.0 in | Wt 223.6 lb

## 2024-08-04 DIAGNOSIS — H90A31 Mixed conductive and sensorineural hearing loss, unilateral, right ear with restricted hearing on the contralateral side: Secondary | ICD-10-CM | POA: Insufficient documentation

## 2024-08-04 DIAGNOSIS — Z6830 Body mass index (BMI) 30.0-30.9, adult: Secondary | ICD-10-CM | POA: Insufficient documentation

## 2024-08-04 DIAGNOSIS — Z23 Encounter for immunization: Secondary | ICD-10-CM | POA: Diagnosis not present

## 2024-08-04 DIAGNOSIS — E119 Type 2 diabetes mellitus without complications: Secondary | ICD-10-CM | POA: Insufficient documentation

## 2024-08-04 DIAGNOSIS — I7121 Aneurysm of the ascending aorta, without rupture: Secondary | ICD-10-CM | POA: Insufficient documentation

## 2024-08-04 DIAGNOSIS — E7800 Pure hypercholesterolemia, unspecified: Secondary | ICD-10-CM | POA: Insufficient documentation

## 2024-08-04 DIAGNOSIS — Z974 Presence of external hearing-aid: Secondary | ICD-10-CM | POA: Diagnosis not present

## 2024-08-04 DIAGNOSIS — R931 Abnormal findings on diagnostic imaging of heart and coronary circulation: Secondary | ICD-10-CM | POA: Insufficient documentation

## 2024-08-04 DIAGNOSIS — E559 Vitamin D deficiency, unspecified: Secondary | ICD-10-CM | POA: Insufficient documentation

## 2024-08-04 DIAGNOSIS — I10 Essential (primary) hypertension: Secondary | ICD-10-CM | POA: Insufficient documentation

## 2024-08-04 DIAGNOSIS — Z8051 Family history of malignant neoplasm of kidney: Secondary | ICD-10-CM | POA: Insufficient documentation

## 2024-08-04 DIAGNOSIS — E66811 Body mass index (BMI) 30.0-30.9, adult: Secondary | ICD-10-CM

## 2024-08-04 DIAGNOSIS — G4733 Obstructive sleep apnea (adult) (pediatric): Secondary | ICD-10-CM | POA: Insufficient documentation

## 2024-08-04 DIAGNOSIS — E1169 Type 2 diabetes mellitus with other specified complication: Secondary | ICD-10-CM | POA: Diagnosis not present

## 2024-08-04 MED ORDER — MOUNJARO 10 MG/0.5ML ~~LOC~~ SOAJ
10.0000 mg | SUBCUTANEOUS | 2 refills | Status: AC
  Filled 2024-08-04 – 2024-08-27 (×2): qty 6, 84d supply, fill #0

## 2024-08-04 MED ORDER — ROSUVASTATIN CALCIUM 20 MG PO TABS
20.0000 mg | ORAL_TABLET | Freq: Every day | ORAL | 1 refills | Status: AC
  Filled 2024-08-04: qty 90, 90d supply, fill #0

## 2024-08-04 MED ORDER — OLMESARTAN MEDOXOMIL 20 MG PO TABS
20.0000 mg | ORAL_TABLET | Freq: Every day | ORAL | 1 refills | Status: AC
  Filled 2024-08-04: qty 90, 90d supply, fill #0

## 2024-08-05 NOTE — Progress Notes (Signed)
 Assessment & Plan:   1. Type 2 diabetes mellitus, controlled with Mounjaro  (Primary) Comments: Controlled. Continue Mounjaro  and lifestyle interventions. Labs at physical early 2026. - tirzepatide  (MOUNJARO ) 10 MG/0.5ML Pen; Inject 10 mg into the skin once a week.  Dispense: 6 mL; Refill: 2  2. Obstructive sleep apnea syndrome, on CPAP with download 05/08/24 Comments: Doing well. Continue current treatment.  3. Essential hypertension Comments: Improving with weight loss. Monitor for hypotension. 4cm thoracic aneurysm noted on CT. Followed by Dr. Ladona. - olmesartan  (BENICAR ) 20 MG tablet; Take 1 tablet (20 mg total) by mouth daily.  Dispense: 90 each; Refill: 1  4. Pure hypercholesterolemia, LDL goal < 70 Comments: Cardiac CT score elevated. Continue statin. - rosuvastatin  (CRESTOR ) 20 MG tablet; Take 1 tablet (20 mg total) by mouth daily.  Dispense: 90 each; Refill: 1  5. Immunization due - Flu vaccine HIGH DOSE PF(Fluzone Trivalent)  6. Family history of malignant neoplasm of kidney in father, uncles, cousin; US  yearly - US  RENAL; Future  7. Aneurysm of ascending aorta without rupture; see Cardiac CT 03/19/24; 4 cm  8. Abnormal screening cardiac CT, 03/19/21 Score 146, left main: 10.5, LAD: 18.7, LCx: 117, RCA: 0 Comments: ECHO 02/22/21  9. Wears hearing aid in both ears  10. Mixed conductive and sensorineural hearing loss of right ear with restricted hearing of left ear  11. Class 1 obesity without serious comorbidity with body mass index (BMI) of 30.0 to 30.9 in adult, unspecified obesity type  I personally spent a total of 48 minutes in the care of the patient today including preparing to see the patient, performing a medically appropriate exam/evaluation, counseling and educating, placing orders, and documenting clinical information in the EHR.  Geni Shutter, DO, MS, FAAFP, Dipl. [facility] [address] [place],  [address] Dept: [phone] Dept Fax: [phone]  Subjective:   Patient is well-known to me from previous care setting and is establishing care in this system with me as PCP. Prior records reviewed when available. Chart updated today with reconciliation of problem list, medications, allergies, and relevant history. Preventive care and chronic disease status reviewed. Portions of historical chart may remain incomplete; will update on an ongoing basis as clinically indicated.  Review of Systems: Negative, with the exception of above mentioned in HPI.  Current Outpatient Medications:    olmesartan  (BENICAR ) 20 MG tablet, Take 1 tablet (20 mg total) by mouth daily., Disp: 90 each, Rfl: 1   rosuvastatin  (CRESTOR ) 20 MG tablet, Take 1 tablet (20 mg total) by mouth daily., Disp: 90 each, Rfl: 1   tirzepatide  (MOUNJARO ) 10 MG/0.5ML Pen, Inject 10 mg into the skin once a week., Disp: 6 mL, Rfl: 2   Objective:   BP (!) 112/58 (BP Location: Left Arm, Cuff Size: Large)   Pulse 61   Ht 5' 11 (1.803 m)   Wt 223 lb 9.6 oz (101.4 kg)   SpO2 97%   BMI 31.19 kg/m   Wt Readings from Last 3 Encounters:  08/04/24 223 lb 9.6 oz (101.4 kg)  05/10/21 247 lb 12.8 oz (112.4 kg)  02/07/21 248 lb (112.5 kg)   Physical Exam Constitutional:      General: He is not in acute distress.    Appearance: He is well-developed.  HENT:     Head: Normocephalic and atraumatic.     Right Ear: Tympanic membrane and ear canal normal.     Left Ear: Tympanic membrane and ear canal normal.  Ears:     Comments: Hearing aids bilaterally. Eyes:     Conjunctiva/sclera: Conjunctivae normal.  Cardiovascular:     Rate and Rhythm: Normal rate and regular rhythm.     Heart sounds: Normal heart sounds.  Pulmonary:     Effort: Pulmonary effort is normal.     Breath sounds: Normal breath sounds.  Neurological:     General: No focal deficit present.     Mental Status: He is alert.  Psychiatric:        Behavior: Behavior  normal.

## 2024-08-13 ENCOUNTER — Ambulatory Visit (HOSPITAL_BASED_OUTPATIENT_CLINIC_OR_DEPARTMENT_OTHER)
Admission: RE | Admit: 2024-08-13 | Discharge: 2024-08-13 | Disposition: A | Discharge status: Home / Self Care | Source: Ambulatory Visit | Attending: Family Medicine | Admitting: Family Medicine

## 2024-08-13 ENCOUNTER — Ambulatory Visit: Payer: Self-pay | Admitting: Family Medicine

## 2024-08-13 DIAGNOSIS — Z8051 Family history of malignant neoplasm of kidney: Secondary | ICD-10-CM | POA: Diagnosis present

## 2024-08-13 DIAGNOSIS — N2889 Other specified disorders of kidney and ureter: Secondary | ICD-10-CM

## 2024-08-13 DIAGNOSIS — Z1379 Encounter for other screening for genetic and chromosomal anomalies: Secondary | ICD-10-CM | POA: Diagnosis present

## 2024-08-13 LAB — OPHTHALMOLOGY REPORT-SCANNED

## 2024-08-17 ENCOUNTER — Ambulatory Visit
Admission: RE | Admit: 2024-08-17 | Discharge: 2024-08-17 | Disposition: A | Discharge status: Home / Self Care | Source: Ambulatory Visit | Attending: Family Medicine | Admitting: Family Medicine

## 2024-08-17 DIAGNOSIS — N2889 Other specified disorders of kidney and ureter: Secondary | ICD-10-CM

## 2024-08-17 DIAGNOSIS — Z8051 Family history of malignant neoplasm of kidney: Secondary | ICD-10-CM

## 2024-08-17 MED ORDER — IOPAMIDOL (ISOVUE-300) INJECTION 61%
100.0000 mL | Freq: Once | INTRAVENOUS | Status: AC | PRN
  Administered 2024-08-17: 100 mL via INTRAVENOUS

## 2024-08-19 ENCOUNTER — Ambulatory Visit: Payer: Self-pay | Admitting: Family Medicine

## 2024-08-23 ENCOUNTER — Other Ambulatory Visit (HOSPITAL_BASED_OUTPATIENT_CLINIC_OR_DEPARTMENT_OTHER): Payer: Self-pay

## 2024-08-27 ENCOUNTER — Other Ambulatory Visit (HOSPITAL_BASED_OUTPATIENT_CLINIC_OR_DEPARTMENT_OTHER): Payer: Self-pay

## 2024-10-29 ENCOUNTER — Encounter: Admitting: Family Medicine
